# Patient Record
Sex: Female | Born: 1960 | Race: White | Hispanic: No | Marital: Married | Smoking: Never smoker
Health system: Southern US, Community
[De-identification: ages and names within clinical notes are randomized; demographics above are authoritative.]

## PROBLEM LIST (undated history)

## (undated) DIAGNOSIS — R638 Other symptoms and signs concerning food and fluid intake: Secondary | ICD-10-CM

## (undated) DIAGNOSIS — D259 Leiomyoma of uterus, unspecified: Secondary | ICD-10-CM

## (undated) DIAGNOSIS — J309 Allergic rhinitis, unspecified: Secondary | ICD-10-CM

## (undated) DIAGNOSIS — N814 Uterovaginal prolapse, unspecified: Secondary | ICD-10-CM

## (undated) DIAGNOSIS — IMO0002 Reserved for concepts with insufficient information to code with codable children: Secondary | ICD-10-CM

## (undated) DIAGNOSIS — N898 Other specified noninflammatory disorders of vagina: Secondary | ICD-10-CM

## (undated) DIAGNOSIS — I10 Essential (primary) hypertension: Secondary | ICD-10-CM

## (undated) DIAGNOSIS — N816 Rectocele: Secondary | ICD-10-CM

## (undated) DIAGNOSIS — D239 Other benign neoplasm of skin, unspecified: Secondary | ICD-10-CM

## (undated) DIAGNOSIS — K5900 Constipation, unspecified: Secondary | ICD-10-CM

## (undated) DIAGNOSIS — K219 Gastro-esophageal reflux disease without esophagitis: Secondary | ICD-10-CM

## (undated) DIAGNOSIS — N393 Stress incontinence (female) (male): Secondary | ICD-10-CM

## (undated) DIAGNOSIS — N951 Menopausal and female climacteric states: Secondary | ICD-10-CM

## (undated) DIAGNOSIS — E7800 Pure hypercholesterolemia, unspecified: Secondary | ICD-10-CM

## (undated) DIAGNOSIS — L570 Actinic keratosis: Secondary | ICD-10-CM

## (undated) DIAGNOSIS — R5382 Chronic fatigue, unspecified: Secondary | ICD-10-CM

## (undated) HISTORY — DX: Rectocele: N81.6

## (undated) HISTORY — DX: Stress incontinence (female) (male): N39.3

## (undated) HISTORY — DX: Leiomyoma of uterus, unspecified: D25.9

## (undated) HISTORY — DX: Allergic rhinitis, unspecified: J30.9

## (undated) HISTORY — DX: Gastro-esophageal reflux disease without esophagitis: K21.9

## (undated) HISTORY — DX: Actinic keratosis: L57.0

## (undated) HISTORY — DX: Pure hypercholesterolemia, unspecified: E78.00

## (undated) HISTORY — DX: Reserved for concepts with insufficient information to code with codable children: IMO0002

## (undated) HISTORY — DX: Menopausal and female climacteric states: N95.1

## (undated) HISTORY — DX: Other symptoms and signs concerning food and fluid intake: R63.8

## (undated) HISTORY — PX: TONSILLECTOMY: SUR1361

## (undated) HISTORY — DX: Uterovaginal prolapse, unspecified: N81.4

## (undated) HISTORY — PX: MOHS SURGERY: SUR867

## (undated) HISTORY — DX: Other specified noninflammatory disorders of vagina: N89.8

## (undated) HISTORY — PX: COLONOSCOPY: SHX174

---
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
## 2003-12-31 HISTORY — PX: BREAST CYST ASPIRATION: SHX578

## 2004-10-30 ENCOUNTER — Ambulatory Visit: Payer: Self-pay | Admitting: Internal Medicine

## 2006-09-11 ENCOUNTER — Ambulatory Visit: Payer: Self-pay | Admitting: Internal Medicine

## 2006-09-18 ENCOUNTER — Ambulatory Visit: Payer: Self-pay | Admitting: Internal Medicine

## 2006-10-27 ENCOUNTER — Ambulatory Visit: Payer: Self-pay | Admitting: Internal Medicine

## 2008-02-11 ENCOUNTER — Ambulatory Visit: Payer: Self-pay | Admitting: Internal Medicine

## 2011-05-01 ENCOUNTER — Ambulatory Visit: Payer: Self-pay | Admitting: Internal Medicine

## 2011-12-31 LAB — HM COLONOSCOPY: HM COLON: NORMAL

## 2012-02-06 ENCOUNTER — Ambulatory Visit: Payer: Self-pay | Admitting: Obstetrics and Gynecology

## 2012-02-06 LAB — CBC
HCT: 42 % (ref 35.0–47.0)
HGB: 13.9 g/dL (ref 12.0–16.0)
MCH: 29 pg (ref 26.0–34.0)
MCHC: 33.1 g/dL (ref 32.0–36.0)
MCV: 87 fL (ref 80–100)
Platelet: 229 10*3/uL (ref 150–440)
RBC: 4.8 10*6/uL (ref 3.80–5.20)
RDW: 13.9 % (ref 11.5–14.5)

## 2012-02-10 ENCOUNTER — Ambulatory Visit: Payer: Self-pay | Admitting: Obstetrics and Gynecology

## 2012-02-10 LAB — PREGNANCY, URINE: Pregnancy Test, Urine: NEGATIVE m[IU]/mL

## 2012-09-23 ENCOUNTER — Ambulatory Visit: Payer: Self-pay | Admitting: Internal Medicine

## 2013-09-24 ENCOUNTER — Ambulatory Visit: Payer: Self-pay | Admitting: Internal Medicine

## 2014-08-27 DIAGNOSIS — R5382 Chronic fatigue, unspecified: Secondary | ICD-10-CM | POA: Insufficient documentation

## 2014-08-27 DIAGNOSIS — I10 Essential (primary) hypertension: Secondary | ICD-10-CM | POA: Insufficient documentation

## 2014-10-14 ENCOUNTER — Ambulatory Visit: Payer: Self-pay | Admitting: Internal Medicine

## 2015-04-20 LAB — HM PAP SMEAR: HM Pap smear: NEGATIVE

## 2015-04-23 NOTE — H&P (Signed)
PATIENT NAME:  [name], [name] MR#:  [id number] DATE OF BIRTH:  [birth year]  DATE OF ADMISSION:  02/10/2012  CHIEF COMPLAINT: Left Bartholin's gland cyst.  HISTORY OF PRESENT ILLNESS:  Kristy Greene is a 54 year old married white female, para 2-0-0, perimenopausal, on no hormone replacement therapy, using vasectomy for contraception, who presents for surgical management of a newly identified left Bartholin's gland cyst. The patient noted this cystic mass in the vulva approximately four weeks ago. She is minimally symptomatic at this time.   PAST MEDICAL HISTORY:  1. Increased body mass.  2. Increased cholesterol.   PAST SURGICAL HISTORY: Cesarean section times one.  PAST OB HISTORY: Para 2-0-0-2.   FAMILY HISTORY: Colon cancer in grandfather and great grandfather.  Diabetes mellitus in grandmother. Hypertension in parents. No history of ovary or breast cancer.   SOCIAL HISTORY: The patient does not smoke, does not drink, does not use drugs.  REVIEW OF SYSTEMS: The patient denies recent illness, reactive airway disease, history of coagulopathy.   CURRENT MEDICATIONS: None.   DRUG ALLERGIES: None.  PHYSICAL EXAMINATION: VITAL SIGNS:  Height 5 feet 6 inches, weight 213 pounds. Blood pressure 131/90, heart rate 94. The patient is a pleasant, well-appearing white female with normal affect. She is alert and oriented.   HEENT: Normocephalic, atraumatic. Oropharynx clear.   NECK: Supple. No thyromegaly or adenopathy.   LUNGS: Clear.   HEART:  Regular rate and rhythm without murmur.   ABDOMEN: Soft, nontender.   PELVIC:  Vulva notable for a 4-cm cystic mass in the left labia majora consistent with Bartholin's gland cyst; no significant induration or drainage noted. Right labia normal. Bimanual exam reveals a slightly enlarged uterus which is nontender.   EXTREMITIES: Without clubbing, cyanosis, or edema.   IMPRESSION: Left Bartholin's gland cyst.   PLAN: Marsupialization of  Bartholin's gland cyst on 02/10/2012.  CONSENT NOTE: The patient is to undergo marsupialization of the left Bartholin's gland cyst. She is understanding of the planned procedure and is aware of and is accepting of all surgical risks which include but are not limited to bleeding, infection, pelvic organ injury with need for repair, blood clot disorders, and anesthesia risks. All questions are answered. Informed consent is given. The patient is ready and willing to proceed with surgery as scheduled.  ____________________________ Alanda Slim Ranie Chinchilla, MD mad:bjt D: 02/07/2012 10:38:35 ET T: 02/07/2012 10:52:11 ET JOB#: [id number]  cc: Hassell Done A. Birdena Kingma, MD, <Dictator> Alanda Slim Kwali Wrinkle MD ELECTRONICALLY SIGNED 02/14/2012 14:39

## 2015-04-23 NOTE — Op Note (Signed)
PATIENT NAME:  Kristy Greene, Kristy Greene MR#:  [id number] DATE OF BIRTH:  [birth year]  DATE OF PROCEDURE:  02/10/2012  PREOPERATIVE DIAGNOSES: Left Bartholin gland cyst.  POSTOPERATIVE DIAGNOSIS: Left Bartholin gland cyst.  OPERATIVE PROCEDURE: Marsupialization.   SURGEON: Alanda Slim. Reinhard Schack, MD  ANESTHESIA: General by LMA.   INDICATIONS: The patient is a 54 year old white female who presents for surgical management of a newly identified Bartholin gland cyst approximately one month ago.   FINDINGS AT SURGERY: A 4 cm tense mass in the region of the left Bartholin's gland. Upon opening the gland 20 mL of purulent drainage was evacuated. The cyst wall was notably smooth and was not biopsied.   DESCRIPTION OF PROCEDURE: The patient was brought to the Operating Room where she was placed in the supine position. General anesthesia with LMA was induced without difficulty. She was placed in the dorsal lithotomy position and was prepped and draped with a Betadine prep and standard fashion. A Red Robinson catheter was used to drain residual urine from the bladder. Allis clamps were used to grasp the superior and inferior margins of the inner aspect, at the introitus, encompassing the Bartholin gland cyst. The vaginal mucosa was incised and the cyst wall was dissected free. Once adequately dissected, the cyst was entered with a stab puncture with a scalpel and extended vertically. Approximately 20 mL of purulent drainage was evacuated. Palpation of the cyst wall revealed it to be smooth and without nodularity or induration. The marsupialization was then performed with a series of 3-0 Vicryl sutures being placed in a simple interrupted manner circumferentially maintaining the gland opened. The marsupialization site was then copiously irrigated with saline. The procedure was then terminated with all instrumentation being removed from the operative field. The patient was then     mobilized, awakened, and taken to  the Recovery Room in satisfactory condition. Estimated blood loss was 25 mL. There were no complications. All instrument, needle, and sponge counts were verified as correct. ____________________________ Alanda Slim. Kristy Headley, MD mad:slb D: 02/10/2012 19:56:30 ET T: 02/11/2012 09:16:43 ET JOB#: [id number]  cc: Kristy Done A. Rael Yo, MD, <Dictator> Alanda Slim Jovaun Levene MD ELECTRONICALLY SIGNED 02/14/2012 14:40

## 2015-09-19 ENCOUNTER — Other Ambulatory Visit: Payer: Self-pay | Admitting: Internal Medicine

## 2015-09-19 DIAGNOSIS — Z1231 Encounter for screening mammogram for malignant neoplasm of breast: Secondary | ICD-10-CM

## 2015-10-16 ENCOUNTER — Ambulatory Visit
Admission: RE | Admit: 2015-10-16 | Discharge: 2015-10-16 | Disposition: A | Discharge status: Home / Self Care | Payer: BC Managed Care – PPO | Source: Ambulatory Visit | Attending: Internal Medicine | Admitting: Internal Medicine

## 2015-10-16 DIAGNOSIS — Z1231 Encounter for screening mammogram for malignant neoplasm of breast: Secondary | ICD-10-CM | POA: Insufficient documentation

## 2016-04-23 ENCOUNTER — Ambulatory Visit (INDEPENDENT_AMBULATORY_CARE_PROVIDER_SITE_OTHER): Payer: BC Managed Care – PPO | Admitting: Obstetrics and Gynecology

## 2016-04-23 ENCOUNTER — Encounter: Payer: Self-pay | Admitting: Obstetrics and Gynecology

## 2016-04-23 VITALS — BP 129/84 | HR 73 | Ht 66.0 in | Wt 192.2 lb

## 2016-04-23 DIAGNOSIS — Z01419 Encounter for gynecological examination (general) (routine) without abnormal findings: Secondary | ICD-10-CM

## 2016-04-23 DIAGNOSIS — Z1211 Encounter for screening for malignant neoplasm of colon: Secondary | ICD-10-CM

## 2016-04-23 DIAGNOSIS — Z0000 Encounter for general adult medical examination without abnormal findings: Secondary | ICD-10-CM

## 2016-04-23 DIAGNOSIS — R102 Pelvic and perineal pain: Secondary | ICD-10-CM | POA: Diagnosis not present

## 2016-04-23 DIAGNOSIS — N393 Stress incontinence (female) (male): Secondary | ICD-10-CM | POA: Diagnosis not present

## 2016-04-23 DIAGNOSIS — D259 Leiomyoma of uterus, unspecified: Secondary | ICD-10-CM

## 2016-04-23 DIAGNOSIS — Z780 Asymptomatic menopausal state: Secondary | ICD-10-CM

## 2016-04-23 NOTE — Progress Notes (Signed)
Patient ID: Kristy Greene, female   DOB: 12/24/1961, 55 y.o.   MRN: [id number] ANNUAL PREVENTATIVE CARE GYN  ENCOUNTER NOTE  Subjective:       Kristy Greene is a 55 y.o. No obstetric history on file. female here for a routine annual gynecologic exam.  Current complaints: 1.  Left side pain at times- ? D/t fibroid  Bowel and bladder function are normal. Stress incontinence is minimal. No significant vasomotor symptoms. No significant discomfort with intercourse   Gynecologic History No LMP recorded. Patient is postmenopausal. Contraception: post menopausal status Last Pap: 03/2015 neg/ neg. Results were: normal Last mammogram: 2016-thru pcp. Results were: normal  Obstetric History OB History  No data available    Past Medical History  Diagnosis Date  . HPV test positive   . AR (allergic rhinitis)   . Rectocele   . Menopausal state   . SUI (stress urinary incontinence, female)   . Uterine prolapse   . Cystocele   . Increased BMI   . Hypercholesteremia   . Uterine fibroid   . Vaginal dryness     Past Surgical History  Procedure Laterality Date  . Cesarean section    . Breast cyst aspiration Left 2005    Current Outpatient Prescriptions on File Prior to Visit  Medication Sig Dispense Refill  . amLODipine (NORVASC) 5 MG tablet Take by mouth.     No current facility-administered medications on file prior to visit.    No Known Allergies  Social History   Social History  . Marital Status: Married    Spouse Name: N/A  . Number of Children: N/A  . Years of Education: N/A   Occupational History  . Not on file.   Social History Main Topics  . Smoking status: Never Smoker   . Smokeless tobacco: Not on file  . Alcohol Use: Yes     Comment: occas  . Drug Use: No  . Sexual Activity: Yes    Birth Control/ Protection: Post-menopausal   Other Topics Concern  . Not on file   Social History Narrative    Family History  Problem Relation Age of Onset  .  Diabetes Mother   . Heart disease Mother   . Uterine cancer Sister   . Colon cancer Maternal Grandfather   . Diabetes Paternal Grandmother   . Breast cancer Neg Hx   . Ovarian cancer Neg Hx     The following portions of the patient's history were reviewed and updated as appropriate: allergies, current medications, past family history, past medical history, past social history, past surgical history and problem list.  Review of Systems ROS Review of Systems - General ROS: negative for - chills, fatigue, fever, hot flashes, night sweats, weight gain or weight loss Psychological ROS: negative for - anxiety, decreased libido, depression, mood swings, physical abuse or sexual abuse Ophthalmic ROS: negative for - blurry vision, eye pain or loss of vision ENT ROS: negative for - headaches, hearing change, visual changes or vocal changes Allergy and Immunology ROS: negative for - hives, itchy/watery eyes or seasonal allergies Hematological and Lymphatic ROS: negative for - bleeding problems, bruising, swollen lymph nodes or weight loss Endocrine ROS: negative for - galactorrhea, hair pattern changes, hot flashes, malaise/lethargy, mood swings, palpitations, polydipsia/polyuria, skin changes, temperature intolerance or unexpected weight changes Breast ROS: negative for - new or changing breast lumps or nipple discharge Respiratory ROS: negative for - cough or shortness of breath Cardiovascular ROS: negative for - chest pain, irregular  heartbeat, palpitations or shortness of breath Gastrointestinal ROS: no abdominal pain, change in bowel habits, or black or bloody stools Genito-Urinary ROS: no dysuria, trouble voiding, or hematuria Musculoskeletal ROS: negative for - joint pain or joint stiffness Neurological ROS: negative for - bowel and bladder control changes Dermatological ROS: negative for rash and skin lesion changes   Objective:   BP 129/84 mmHg  Pulse 73  Ht 5\' 6"  (1.676 m)  Wt 192 lb  3.2 oz (87.181 kg)  BMI 31.04 kg/m2 CONSTITUTIONAL: Well-developed, well-nourished female in no acute distress.  PSYCHIATRIC: Normal mood and affect. Normal behavior. Normal judgment and thought content. [place]: Alert and oriented to person, place, and time. Normal muscle tone coordination. No cranial nerve deficit noted. HENT:  Normocephalic, atraumatic, External right and left ear normal. Oropharynx is clear and moist EYES: Conjunctivae and EOM are normal. Pupils are equal, round, and reactive to light. No scleral icterus.  NECK: Normal range of motion, supple, no masses.  Normal thyroid.  SKIN: Skin is warm and dry. No rash noted. Not diaphoretic. No erythema. No pallor. CARDIOVASCULAR: Normal heart rate noted, regular rhythm, no murmur. RESPIRATORY: Clear to auscultation bilaterally. Effort and breath sounds normal, no problems with respiration noted. BREASTS: Symmetric in size. No masses, skin changes, nipple drainage, or lymphadenopathy. ABDOMEN: Soft, normal bowel sounds, no distention noted.  No tenderness, rebound or guarding.  BLADDER: Normal PELVIC:  External Genitalia: Normal  BUS: Normal  Vagina: Normal  Cervix: Normal; no cervical motion tenderness  Uterus: Normal; midplane, top normal size, mobile, minimally tender  Adnexa: Normal; no palpable masses; left lower quadrant slightly tender  RV: External Exam NormaI, No Rectal Masses and Normal Sphincter tone  MUSCULOSKELETAL: Normal range of motion. No tenderness.  No cyanosis, clubbing, or edema.  2+ distal pulses. LYMPHATIC: No Axillary, Supraclavicular, or Inguinal Adenopathy.    Assessment:   Annual gynecologic examination 55 y.o. Contraception: post menopausal status and rhythm method [id number]  Menopause, asymptomatic Left lower quadrant pain, unclear etiology; history of fibroids  Plan:  Pap: Not needed Mammogram: thru pcp Stool Guaiac Testing:  Ordered Labs: thru pcp Routine preventative health maintenance  measures emphasized: Exercise/Diet/Weight control, Tobacco Warnings and Alcohol/Substance use risks Calcium with vitamin D supplementation encouraged Pelvic ultrasound ordered. Patient was notified by phone of results. Return to [place], [place]  Brayton Mars, MD  Note: This dictation was prepared with Dragon dictation along with smaller phrase technology. Any transcriptional errors that result from this process are unintentional.

## 2016-04-23 NOTE — Patient Instructions (Signed)
1. Pap smear not done until 2019 2. Mammogram already done 3. Stool guaiac cards given 4. Calcium with vitamin D supplementation encouraged-1200 mg a day 5. Ultrasound is ordered to assess left lower quadrant pain 6. Return in 1 year

## 2016-04-26 ENCOUNTER — Other Ambulatory Visit: Payer: BC Managed Care – PPO

## 2016-04-26 ENCOUNTER — Ambulatory Visit (INDEPENDENT_AMBULATORY_CARE_PROVIDER_SITE_OTHER): Payer: BC Managed Care – PPO

## 2016-04-26 DIAGNOSIS — R102 Pelvic and perineal pain: Secondary | ICD-10-CM

## 2016-05-02 ENCOUNTER — Telehealth: Payer: Self-pay | Admitting: *Deleted

## 2016-05-02 LAB — FECAL OCCULT BLOOD, IMMUNOCHEMICAL: FECAL OCCULT BLD: NEGATIVE

## 2016-05-02 LAB — PLEASE NOTE

## 2016-05-02 NOTE — Telephone Encounter (Signed)
INFORMED PT OF LAB RESULTS.

## 2016-05-02 NOTE — Telephone Encounter (Signed)
Patient called and stated that the nurse called her yesterday and she is returning the nurse call. Patient call back number is [phone]

## 2016-09-20 ENCOUNTER — Other Ambulatory Visit: Payer: Self-pay | Admitting: Internal Medicine

## 2016-09-20 DIAGNOSIS — Z1231 Encounter for screening mammogram for malignant neoplasm of breast: Secondary | ICD-10-CM

## 2016-10-24 ENCOUNTER — Ambulatory Visit
Admission: RE | Admit: 2016-10-24 | Discharge: 2016-10-24 | Disposition: A | Discharge status: Home / Self Care | Payer: BC Managed Care – PPO | Source: Ambulatory Visit | Attending: Internal Medicine | Admitting: Internal Medicine

## 2016-10-24 ENCOUNTER — Other Ambulatory Visit: Payer: Self-pay | Admitting: Internal Medicine

## 2016-10-24 DIAGNOSIS — Z1231 Encounter for screening mammogram for malignant neoplasm of breast: Secondary | ICD-10-CM | POA: Insufficient documentation

## 2017-04-28 NOTE — Progress Notes (Signed)
Patient ID: Kristy Greene, female   DOB: [birth year], 56 y.o.   MRN: [id number] ANNUAL PREVENTATIVE CARE GYN  ENCOUNTER NOTE  Subjective:       Kristy Greene is a 56 y.o.G2 P2002. female here for a routine annual gynecologic exam.  Current complaints: 1. Pelvic pain  2. Urinary frequency 3. noturia- 1-2 x qhs  Kristy Greene presents for annual exam. She is post menopausal with minimal hot flashes, mostly at night, on no HRT.  Current concerns include an increase aching on her sides bilaterally that isn't worsened with activity or any specific event. She also has increase frequency of constipation over the last 2 months for which she has not tried any stool softeners. Denies blood in the stool. Also admits to increased urinary frequency and urgency but denies having to get up more than 2x/night and denies stress incontinence symptoms. She denies fever, chills, increased fatigue, palpitations, vaginal dryness, and vaginal bleeding.   Gynecologic History No LMP recorded. Patient is postmenopausal. Contraception: post menopausal status Last Pap: 03/2015 neg/ neg. Results were: normal Last mammogram:09/2016 birad 1. Results were: normal  Obstetric History OB History  No data available    Past Medical History:  Diagnosis Date  . AR (allergic rhinitis)   . Cystocele   . HPV test positive   . Hypercholesteremia   . Increased BMI   . Menopausal state   . Rectocele   . SUI (stress urinary incontinence, female)   . Uterine fibroid   . Uterine prolapse   . Vaginal dryness     Past Surgical History:  Procedure Laterality Date  . BREAST CYST ASPIRATION Left 2005  . CESAREAN SECTION      Current Outpatient Prescriptions on File Prior to Visit  Medication Sig Dispense Refill  . amLODipine (NORVASC) 5 MG tablet Take by mouth.    . Multiple Vitamin (MULTI VITAMIN DAILY PO) Take by mouth.     No current facility-administered medications on file prior to visit.     No Known  Allergies  Social History   Social History  . Marital status: Married    Spouse name: N/A  . Number of children: N/A  . Years of education: N/A   Occupational History  . Not on file.   Social History Main Topics  . Smoking status: Never Smoker  . Smokeless tobacco: Not on file  . Alcohol use Yes     Comment: occas  . Drug use: No  . Sexual activity: Yes    Birth control/ protection: Post-menopausal   Other Topics Concern  . Not on file   Social History Narrative  . No narrative on file    Family History  Problem Relation Age of Onset  . Diabetes Mother   . Heart disease Mother   . Uterine cancer Sister   . Colon cancer Maternal Grandfather   . Diabetes Paternal Grandmother   . Breast cancer Neg Hx   . Ovarian cancer Neg Hx     The following portions of the patient's history were reviewed and updated as appropriate: allergies, current medications, past family history, past medical history, past social history, past surgical history and problem list.  Review of Systems Review of Systems  Constitutional: Negative.   HENT: Negative for hearing loss.   Eyes: Negative for blurred vision, double vision and pain.  Respiratory: Negative for cough and shortness of breath.   Cardiovascular: Negative for chest pain and palpitations.  Gastrointestinal: Positive for abdominal pain and constipation.  Negative for blood in stool, nausea and vomiting.  Genitourinary: Positive for frequency and urgency. Negative for dysuria and hematuria.  Musculoskeletal: Negative for joint pain and neck pain.  Neurological: Negative for headaches.    Objective:   BP (!) 141/81   Pulse 68   Ht 5\' 6"  (1.676 m)   Wt 200 lb 14.4 oz (91.1 kg)   BMI 32.43 kg/m   CONSTITUTIONAL: Well-developed, well-nourished female in no acute distress.  PSYCHIATRIC: Normal mood and affect. Normal behavior. Normal judgment and thought content. Kristy Greene: Alert. Normal muscle tone coordination. No cranial  nerve deficit noted. HENT:  Normocephalic, atraumatic. EYES: Conjunctivae and EOM are normal. No scleral icterus.  NECK: Normal range of motion, supple, no masses.  Normal thyroid.  SKIN: Skin is warm and dry. No rash noted. Not diaphoretic. No erythema. No pallor. CARDIOVASCULAR: RRR, no murmur. RESPIRATORY: Clear to auscultation bilaterally. Effort and breath sounds normal. BREASTS: Symmetric in size. No fibrocystic changes, masses, nodules, skin changes, nipple drainage, or lymphadenopathy. ABDOMEN: Soft, normal bowel sounds, no distention noted.  No tenderness, or guarding.  BLADDER: Normal PELVIC:  External Genitalia: Normal  BUS: Normal  Vagina: Normal  Cervix: Normal; no cervical motion tenderness  Uterus: Normal; midplane, top normal size, mobile, minimally tender  Adnexa: Normal; no palpable masses; left lower quadrant slightly tender  RV: External Exam NormaI, No Rectal Masses and Normal Sphincter tone  MUSCULOSKELETAL: Kristy Greene, or edema.  LYMPHATIC: No Axillary or Supraclavicular    Assessment:   Annual gynecologic examination 56 y.o. Contraception: post menopausal status and rhythm method Bmi- 32 weight gain x 10 pounds this year Menopause, minimally symptomatic, no HRT  Constipation Urinary frequency/urgency,non acute   Plan:  Pap: Due 2019 Mammogram: thru pcp- due october Stool Guaiac Testing:  Ordered  Encouraged increased water and fiber intake and use of OTC stool softeners such as Colace.  Check UA, C&S to rule out infection as source of increased urinary frequency/urgency Labs: thru pcp UA with Culture today Routine preventative health maintenance measures emphasized: Exercise/Diet/Weight control, Tobacco Warnings and Alcohol/Substance use risks Calcium with vitamin D supplementation encouraged Return to Clinic - [place], [place]  Aliene Altes, PA-S Brayton Mars, MD   I have seen, interviewed, and examined the patient in  conjunction with the [facility].A. student and affirm the diagnosis and management plan. Moishy Laday A. Kaimana Lurz, MD, FACOG   Note: This dictation was prepared with Dragon dictation along with smaller phrase technology. Any transcriptional errors that result from this process are unintentional.

## 2017-04-29 ENCOUNTER — Encounter: Payer: Self-pay | Admitting: Obstetrics and Gynecology

## 2017-04-29 ENCOUNTER — Ambulatory Visit (INDEPENDENT_AMBULATORY_CARE_PROVIDER_SITE_OTHER): Payer: BC Managed Care – PPO | Admitting: Obstetrics and Gynecology

## 2017-04-29 VITALS — BP 141/81 | HR 68 | Ht 66.0 in | Wt 200.9 lb

## 2017-04-29 DIAGNOSIS — Z01419 Encounter for gynecological examination (general) (routine) without abnormal findings: Secondary | ICD-10-CM

## 2017-04-29 DIAGNOSIS — Z1211 Encounter for screening for malignant neoplasm of colon: Secondary | ICD-10-CM

## 2017-04-29 DIAGNOSIS — R350 Frequency of micturition: Secondary | ICD-10-CM | POA: Diagnosis not present

## 2017-04-29 DIAGNOSIS — Z780 Asymptomatic menopausal state: Secondary | ICD-10-CM

## 2017-04-29 DIAGNOSIS — K5900 Constipation, unspecified: Secondary | ICD-10-CM | POA: Diagnosis not present

## 2017-04-29 DIAGNOSIS — E669 Obesity, unspecified: Secondary | ICD-10-CM

## 2017-04-29 LAB — POCT URINALYSIS DIPSTICK
BILIRUBIN UA: NEGATIVE
Blood, UA: NEGATIVE
GLUCOSE UA: NEGATIVE
Ketones, UA: NEGATIVE
NITRITE UA: NEGATIVE
PH UA: 7 (ref 5.0–8.0)
Protein, UA: NEGATIVE
Spec Grav, UA: 1.01 (ref 1.010–1.025)
Urobilinogen, UA: NEGATIVE E.U./dL — AB

## 2017-04-29 NOTE — Patient Instructions (Signed)
1. No Pap smear needed 2. Mammogram to be obtained in October 3. Stool guaiac cards are given for colon cancer screening 4. Continue with healthy eating, exercise, and bone loss and pounds in 1 year 5. Continue with calcium and vitamin D supplementation 6. Return in 1 year for annual exam 7. Urinalysis and urine culture are obtained to rule out infection  Health Maintenance, Female Adopting a healthy lifestyle and getting preventive care can go a long way to promote health and wellness. Talk with your health care provider about what schedule of regular examinations is right for you. This is a good chance for you to check in with your provider about disease prevention and staying healthy. In between checkups, there are plenty of things you can do on your own. Experts have done a lot of research about which lifestyle changes and preventive measures are most likely to keep you healthy. Ask your health care provider for more information. Weight and diet Eat a healthy diet  Be sure to include plenty of vegetables, fruits, low-fat dairy products, and lean protein.  Do not eat a lot of foods high in solid fats, added sugars, or salt.  Get regular exercise. This is one of the most important things you can do for your health.  Most adults should exercise for at least 150 minutes each week. The exercise should increase your heart rate and make you sweat (moderate-intensity exercise).  Most adults should also do strengthening exercises at least twice a week. This is in addition to the moderate-intensity exercise. Maintain a healthy weight  Body mass index (BMI) is a measurement that can be used to identify possible weight problems. It estimates body fat based on height and weight. Your health care provider can help determine your BMI and help you achieve or maintain a healthy weight.  For females 85 years of age and older:  A BMI below 18.5 is considered underweight.  A BMI of 18.5 to 24.9 is  normal.  A BMI of 25 to 29.9 is considered overweight.  A BMI of 30 and above is considered obese. Watch levels of cholesterol and blood lipids  You should start having your blood tested for lipids and cholesterol at 56 years of age, then have this test every 5 years.  You may need to have your cholesterol levels checked more often if:  Your lipid or cholesterol levels are high.  You are older than 56 years of age.  You are at high risk for heart disease. Cancer screening Lung Cancer  Lung cancer screening is recommended for adults 7-2 years old who are at high risk for lung cancer because of a history of smoking.  A yearly low-dose CT scan of the lungs is recommended for people who:  Currently smoke.  Have quit within the past 15 years.  Have at least a 30-pack-year history of smoking. A pack year is smoking an average of one pack of cigarettes a day for 1 year.  Yearly screening should continue until it has been 15 years since you quit.  Yearly screening should stop if you develop a health problem that would prevent you from having lung cancer treatment. Breast Cancer  Practice breast self-awareness. This means understanding how your breasts normally appear and feel.  It also means doing regular breast self-exams. Let your health care provider know about any changes, no matter how small.  If you are in your 20s or 30s, you should have a clinical breast exam (CBE) by a  health care provider every 1-3 years as part of a regular health exam.  If you are 36 or older, have a CBE every year. Also consider having a breast X-ray (mammogram) every year.  If you have a family history of breast cancer, talk to your health care provider about genetic screening.  If you are at high risk for breast cancer, talk to your health care provider about having an MRI and a mammogram every year.  Breast cancer gene (BRCA) assessment is recommended for women who have family members with  BRCA-related cancers. BRCA-related cancers include:  Breast.  Ovarian.  Tubal.  Peritoneal cancers.  Results of the assessment will determine the need for genetic counseling and BRCA1 and BRCA2 testing. Cervical Cancer  Your health care provider may recommend that you be screened regularly for cancer of the pelvic organs (ovaries, uterus, and vagina). This screening involves a pelvic examination, including checking for microscopic changes to the surface of your cervix (Pap test). You may be encouraged to have this screening done every 3 years, beginning at age 24.  For women ages 57-65, health care providers may recommend pelvic exams and Pap testing every 3 years, or they may recommend the Pap and pelvic exam, combined with testing for human papilloma virus (HPV), every 5 years. Some types of HPV increase your risk of cervical cancer. Testing for HPV may also be done on women of any age with unclear Pap test results.  Other health care providers may not recommend any screening for nonpregnant women who are considered low risk for pelvic cancer and who do not have symptoms. Ask your health care provider if a screening pelvic exam is right for you.  If you have had past treatment for cervical cancer or a condition that could lead to cancer, you need Pap tests and screening for cancer for at least 20 years after your treatment. If Pap tests have been discontinued, your risk factors (such as having a new sexual partner) need to be reassessed to determine if screening should resume. Some women have medical problems that increase the chance of getting cervical cancer. In these cases, your health care provider may recommend more frequent screening and Pap tests. Colorectal Cancer  This type of cancer can be detected and often prevented.  Routine colorectal cancer screening usually begins at 56 years of age and continues through 56 years of age.  Your health care provider may recommend screening at  an earlier age if you have risk factors for colon cancer.  Your health care provider may also recommend using home test kits to check for hidden blood in the stool.  A small camera at the end of a tube can be used to examine your colon directly (sigmoidoscopy or colonoscopy). This is done to check for the earliest forms of colorectal cancer.  Routine screening usually begins at age 26.  Direct examination of the colon should be repeated every 5-10 years through 56 years of age. However, you may need to be screened more often if early forms of precancerous polyps or small growths are found. Skin Cancer  Check your skin from head to toe regularly.  Tell your health care provider about any new moles or changes in moles, especially if there is a change in a mole's shape or color.  Also tell your health care provider if you have a mole that is larger than the size of a pencil eraser.  Always use sunscreen. Apply sunscreen liberally and repeatedly throughout the day.  Protect yourself by wearing long sleeves, pants, a wide-brimmed hat, and sunglasses whenever you are outside. Heart disease, diabetes, and high blood pressure  High blood pressure causes heart disease and increases the risk of stroke. High blood pressure is more likely to develop in:  People who have blood pressure in the high end of the normal range (130-139/85-89 mm Hg).  People who are overweight or obese.  People who are African American.  If you are 103-57 years of age, have your blood pressure checked every 3-5 years. If you are 66 years of age or older, have your blood pressure checked every year. You should have your blood pressure measured twice-once when you are at a hospital or clinic, and once when you are not at a hospital or clinic. Record the average of the two measurements. To check your blood pressure when you are not at a hospital or clinic, you can use:  An automated blood pressure machine at a pharmacy.  A  home blood pressure monitor.  If you are between 2 years and 21 years old, ask your health care provider if you should take aspirin to prevent strokes.  Have regular diabetes screenings. This involves taking a blood sample to check your fasting blood sugar level.  If you are at a normal weight and have a low risk for diabetes, have this test once every three years after 56 years of age.  If you are overweight and have a high risk for diabetes, consider being tested at a younger age or more often. Preventing infection Hepatitis B  If you have a higher risk for hepatitis B, you should be screened for this virus. You are considered at high risk for hepatitis B if:  You were born in a country where hepatitis B is common. Ask your health care provider which countries are considered high risk.  Your parents were born in a high-risk country, and you have not been immunized against hepatitis B (hepatitis B vaccine).  You have HIV or AIDS.  You use needles to inject street drugs.  You live with someone who has hepatitis B.  You have had sex with someone who has hepatitis B.  You get hemodialysis treatment.  You take certain medicines for conditions, including cancer, organ transplantation, and autoimmune conditions. Hepatitis C  Blood testing is recommended for:  Everyone born from [birth year] through 1965.  Anyone with known risk factors for hepatitis C. Sexually transmitted infections (STIs)  You should be screened for sexually transmitted infections (STIs) including gonorrhea and chlamydia if:  You are sexually active and are younger than 56 years of age.  You are older than 56 years of age and your health care provider tells you that you are at risk for this type of infection.  Your sexual activity has changed since you were last screened and you are at an increased risk for chlamydia or gonorrhea. Ask your health care provider if you are at risk.  If you do not have HIV, but are  at risk, it may be recommended that you take a prescription medicine daily to prevent HIV infection. This is called pre-exposure prophylaxis (PrEP). You are considered at risk if:  You are sexually active and do not regularly use condoms or know the HIV status of your partner(s).  You take drugs by injection.  You are sexually active with a partner who has HIV. Talk with your health care provider about whether you are at high risk of being infected with HIV.  If you choose to begin PrEP, you should first be tested for HIV. You should then be tested every 3 months for as long as you are taking PrEP. Pregnancy  If you are premenopausal and you may become pregnant, ask your health care provider about preconception counseling.  If you may become pregnant, take 400 to 800 micrograms (mcg) of folic acid every day.  If you want to prevent pregnancy, talk to your health care provider about birth control (contraception). Osteoporosis and menopause  Osteoporosis is a disease in which the bones lose minerals and strength with aging. This can result in serious bone fractures. Your risk for osteoporosis can be identified using a bone density scan.  If you are 35 years of age or older, or if you are at risk for osteoporosis and fractures, ask your health care provider if you should be screened.  Ask your health care provider whether you should take a calcium or vitamin D supplement to lower your risk for osteoporosis.  Menopause may have certain physical symptoms and risks.  Hormone replacement therapy may reduce some of these symptoms and risks. Talk to your health care provider about whether hormone replacement therapy is right for you. Follow these instructions at home:  Schedule regular health, dental, and eye exams.  Stay current with your immunizations.  Do not use any tobacco products including cigarettes, chewing tobacco, or electronic cigarettes.  If you are pregnant, do not drink  alcohol.  If you are breastfeeding, limit how much and how often you drink alcohol.  Limit alcohol intake to no more than 1 drink per day for nonpregnant women. One drink equals 12 ounces of beer, 5 ounces of wine, or 1 ounces of hard liquor.  Do not use street drugs.  Do not share needles.  Ask your health care provider for help if you need support or information about quitting drugs.  Tell your health care provider if you often feel depressed.  Tell your health care provider if you have ever been abused or do not feel safe at home. This information is not intended to replace advice given to you by your health care provider. Make sure you discuss any questions you have with your health care provider. Document Released: 07/01/2011 Document Revised: 05/23/2016 Document Reviewed: 09/19/2015 Elsevier Interactive Patient Education  2017 [employer].

## 2017-05-01 LAB — URINE CULTURE: Organism ID, Bacteria: NO GROWTH

## 2017-06-26 LAB — FECAL OCCULT BLOOD, IMMUNOCHEMICAL: FECAL OCCULT BLD: NEGATIVE

## 2017-06-26 LAB — PLEASE NOTE

## 2017-09-26 ENCOUNTER — Other Ambulatory Visit: Payer: Self-pay | Admitting: Internal Medicine

## 2017-09-26 DIAGNOSIS — Z1231 Encounter for screening mammogram for malignant neoplasm of breast: Secondary | ICD-10-CM

## 2017-10-27 ENCOUNTER — Ambulatory Visit
Admission: RE | Admit: 2017-10-27 | Discharge: 2017-10-27 | Disposition: A | Discharge status: Home / Self Care | Payer: BC Managed Care – PPO | Source: Ambulatory Visit | Attending: Internal Medicine | Admitting: Internal Medicine

## 2017-10-27 DIAGNOSIS — Z1231 Encounter for screening mammogram for malignant neoplasm of breast: Secondary | ICD-10-CM | POA: Diagnosis present

## 2018-05-01 NOTE — Progress Notes (Signed)
Patient ID: Kristy Greene, female   DOB: 24-Dec-1961, 57 y.o.   MRN: [id number] ANNUAL PREVENTATIVE CARE GYN  ENCOUNTER NOTE  Subjective:       Kristy Greene is a 57 y.o.G2 P2002. female here for a routine annual gynecologic exam.  Current complaints:  1. Nocturia sometime 4; Kristy Greene does not report significant urinary frequency, urgency, dysuria, or hematuria.  She does not report any recent bladder infection.  Kristy Greene does not drink significant fluids prior to bedtime; she does not drink significant amounts of caffeinated beverages. 2. Pelvic pain at times; does not affect routine activities  Kristy Greene presents for annual exam. She is post menopausal with minimal hot flashes, mostly at night, on no HRT.   She does get screening labs and her mammograms through primary [facility]   Gynecologic History No LMP recorded. Patient is postmenopausal. Contraception: post menopausal status Last Pap: 03/2015 neg/ neg. Results were: normal Last mammogram:09/2017 birad 1. Results were: normal  Obstetric History OB History  Gravida Para Term Preterm AB Living  2 2 2     2   SAB TAB Ectopic Multiple Live Births          2    # Outcome Date GA Lbr Len/2nd Weight Sex Delivery Anes PTL Lv  2 Term 1994   11 lb 6.4 oz (5.171 kg) M CS-LTranv   LIV  1 Term 1990   9 lb 6.4 oz (4.264 kg) F Vag-Spont   LIV    Past Medical History:  Diagnosis Date  . AR (allergic rhinitis)   . Cystocele   . HPV test positive   . Hypercholesteremia   . Increased BMI   . Menopausal state   . Rectocele   . SUI (stress urinary incontinence, female)   . Uterine fibroid   . Uterine prolapse   . Vaginal dryness     Past Surgical History:  Procedure Laterality Date  . BREAST CYST ASPIRATION Left 2005  . CESAREAN SECTION      Current Outpatient Medications on File Prior to Visit  Medication Sig Dispense Refill  . amLODipine (NORVASC) 5 MG tablet Take by mouth.    . calcium-vitamin D 250-100 MG-UNIT  tablet Take 1 tablet by mouth 2 (two) times daily.    . Multiple Vitamin (MULTI VITAMIN DAILY PO) Take by mouth.     No current facility-administered medications on file prior to visit.     No Known Allergies  Social History   Socioeconomic History  . Marital status: Married    Spouse name: Not on file  . Number of children: Not on file  . Years of education: Not on file  . Highest education level: Not on file  Occupational History  . Not on file  Social Needs  . Financial resource strain: Not on file  . Food insecurity:    Worry: Not on file    Inability: Not on file  . Transportation needs:    Medical: Not on file    Non-medical: Not on file  Tobacco Use  . Smoking status: Never Smoker  . Smokeless tobacco: Never Used  Substance and Sexual Activity  . Alcohol use: Yes    Comment: occas  . Drug use: No  . Sexual activity: Yes    Birth control/protection: Post-menopausal  Lifestyle  . Physical activity:    Days per week: Not on file    Minutes per session: Not on file  . Stress: Not on file  Relationships  .  Social connections:    Talks on phone: Not on file    Gets together: Not on file    Attends religious service: Not on file    Active member of club or organization: Not on file    Attends meetings of clubs or organizations: Not on file    Relationship status: Not on file  . Intimate partner violence:    Fear of current or ex partner: Not on file    Emotionally abused: Not on file    Physically abused: Not on file    Forced sexual activity: Not on file  Other Topics Concern  . Not on file  Social History Narrative  . Not on file    Family History  Problem Relation Age of Onset  . Diabetes Mother   . Heart disease Mother   . Uterine cancer Sister   . Colon cancer Maternal Grandfather   . Diabetes Paternal Grandmother   . Breast cancer Neg Hx   . Ovarian cancer Neg Hx     The following portions of the patient's history were reviewed and updated  as appropriate: allergies, current medications, past family history, past medical history, past social history, past surgical history and problem list.  Review of Systems Review of Systems  Constitutional: Negative.   HENT: Negative.   Eyes: Negative.   Respiratory: Negative.   Cardiovascular: Negative.   Gastrointestinal: Negative.   Genitourinary:       Nocturia 2-4 per night  Musculoskeletal: Negative.   Skin: Positive for rash.       No complaint of rash but vulvar rash noted on exam  Neurological: Negative.   Endo/Heme/Allergies: Negative.   Psychiatric/Behavioral: Negative.      Objective:  BP 118/79   Pulse 73   Ht 5\' 6"  (1.676 m)   Wt 212 lb 4.8 oz (96.3 kg)   BMI 34.27 kg/m  CONSTITUTIONAL: Well-developed, well-nourished female in no acute distress.  PSYCHIATRIC: Normal mood and affect. Normal behavior. Normal judgment and thought content. [place]: Alert. Normal muscle tone coordination. No cranial nerve deficit noted. HENT:  Normocephalic, atraumatic. EYES: Conjunctivae and EOM are normal. No scleral icterus.  NECK: Normal range of motion, supple, no masses.  Normal thyroid.  SKIN: Skin is warm and dry. No rash noted. Not diaphoretic. No erythema. No pallor. CARDIOVASCULAR: RRR, no murmur. RESPIRATORY: Clear to auscultation bilaterally. Effort and breath sounds normal. BREASTS: Symmetric in size. No fibrocystic changes, masses, nodules, skin changes, nipple drainage, or lymphadenopathy. ABDOMEN: Soft, normal bowel sounds, no distention noted.  No tenderness, or guarding.  BLADDER: Normal PELVIC:  External Genitalia: Prominent vulvar hyperemia, symmetric involving labia majora; there appears to be symmetric erythema/chafing in the inguinal region bilaterally (patient notes no vulvar symptoms)  BUS: Normal  Vagina: Normal; no discharge  Cervix: Normal; no cervical motion tenderness  Uterus: Normal; midplane, top normal size, mobile, non-tender  Adnexa: Normal;  no palpable masses; nontender  RV: Firm stool in rectal vault, External Exam NormaI and Normal Sphincter tone MUSCULOSKELETAL: Noyanosis, or edema.  LYMPHATIC: No Axillary or Supraclavicular    Assessment:   Annual gynecologic examination 57 y.o. Contraception: post menopausal status and rhythm method Bmi- 34  Menopause, minimally symptomatic, no HRT  Chronic vulvitis and groin irritation, asymptomatic     Plan:  Pap: pap/hpv Mammogram: thru pcp- due october Stool Guaiac Testing:  Ordered  Labs: thru pcp  Routine preventative health maintenance measures emphasized: Exercise/Diet/Weight control, Tobacco Warnings and Alcohol/Substance use risks Calcium with vitamin D supplementation  encouraged Nystatin/triamcinolone cream topically to the vulva and inguinal region twice a day for 14 days Return to [place], CMA  Brayton Mars, MD  Note: This dictation was prepared with Dragon dictation along with smaller phrase technology. Any transcriptional errors that result from this process are unintentional.

## 2018-05-05 ENCOUNTER — Encounter: Payer: Self-pay | Admitting: Obstetrics and Gynecology

## 2018-05-05 ENCOUNTER — Ambulatory Visit (INDEPENDENT_AMBULATORY_CARE_PROVIDER_SITE_OTHER): Payer: BC Managed Care – PPO | Admitting: Obstetrics and Gynecology

## 2018-05-05 VITALS — BP 118/79 | HR 73 | Ht 66.0 in | Wt 212.3 lb

## 2018-05-05 DIAGNOSIS — Z1211 Encounter for screening for malignant neoplasm of colon: Secondary | ICD-10-CM | POA: Diagnosis not present

## 2018-05-05 DIAGNOSIS — Z01419 Encounter for gynecological examination (general) (routine) without abnormal findings: Secondary | ICD-10-CM

## 2018-05-05 DIAGNOSIS — R351 Nocturia: Secondary | ICD-10-CM

## 2018-05-05 DIAGNOSIS — E669 Obesity, unspecified: Secondary | ICD-10-CM | POA: Diagnosis not present

## 2018-05-05 DIAGNOSIS — N763 Subacute and chronic vulvitis: Secondary | ICD-10-CM

## 2018-05-05 DIAGNOSIS — Z780 Asymptomatic menopausal state: Secondary | ICD-10-CM

## 2018-05-05 LAB — POCT URINALYSIS DIPSTICK
Bilirubin, UA: NEGATIVE
Glucose, UA: NEGATIVE
Ketones, UA: NEGATIVE
LEUKOCYTES UA: NEGATIVE
NITRITE UA: NEGATIVE
Odor: NEGATIVE
RBC UA: NEGATIVE
SPEC GRAV UA: 1.02 (ref 1.010–1.025)
Urobilinogen, UA: 0.2 E.U./dL
pH, UA: 6 (ref 5.0–8.0)

## 2018-05-05 NOTE — Patient Instructions (Addendum)
1.  Pap smear is done. 2.  Mammogram is to be obtained through primary care in October 3.  Screening labs are to be obtained through primary care 4.  Stool guaiac cards are given for colon cancer screening 5.  Continue with healthy eating and exercise and controlled weight loss 6.  Continue calcium and vitamin D supplementation 7.  Nystatin/triamcinolone cream is to be applied topically to the vulvar irritation and the groin irritation twice a day for 2 weeks 8.  Return in 1 year for annual exam 9.  Urine culture is obtained to rule out bladder infection   Health Maintenance for Postmenopausal Women Menopause is a normal process in which your reproductive ability comes to an end. This process happens gradually over a span of months to years, usually between the ages of 66 and 2. Menopause is complete when you have missed 12 consecutive menstrual periods. It is important to talk with your health care provider about some of the most common conditions that affect postmenopausal women, such as heart disease, cancer, and bone loss (osteoporosis). Adopting a healthy lifestyle and getting preventive care can help to promote your health and wellness. Those actions can also lower your chances of developing some of these common conditions. What should I know about menopause? During menopause, you may experience a number of symptoms, such as:  Moderate-to-severe hot flashes.  Night sweats.  Decrease in sex drive.  Mood swings.  Headaches.  Tiredness.  Irritability.  Memory problems.  Insomnia.  Choosing to treat or not to treat menopausal changes is an individual decision that you make with your health care provider. What should I know about hormone replacement therapy and supplements? Hormone therapy products are effective for treating symptoms that are associated with menopause, such as hot flashes and night sweats. Hormone replacement carries certain risks, especially as you become  older. If you are thinking about using estrogen or estrogen with progestin treatments, discuss the benefits and risks with your health care provider. What should I know about heart disease and stroke? Heart disease, heart attack, and stroke become more likely as you age. This may be due, in part, to the hormonal changes that your body experiences during menopause. These can affect how your body processes dietary fats, triglycerides, and cholesterol. Heart attack and stroke are both medical emergencies. There are many things that you can do to help prevent heart disease and stroke:  Have your blood pressure checked at least every 1-2 years. High blood pressure causes heart disease and increases the risk of stroke.  If you are 24-60 years old, ask your health care provider if you should take aspirin to prevent a heart attack or a stroke.  Do not use any tobacco products, including cigarettes, chewing tobacco, or electronic cigarettes. If you need help quitting, ask your health care provider.  It is important to eat a healthy diet and maintain a healthy weight. ? Be sure to include plenty of vegetables, fruits, low-fat dairy products, and lean protein. ? Avoid eating foods that are high in solid fats, added sugars, or salt (sodium).  Get regular exercise. This is one of the most important things that you can do for your health. ? Try to exercise for at least 150 minutes each week. The type of exercise that you do should increase your heart rate and make you sweat. This is known as moderate-intensity exercise. ? Try to do strengthening exercises at least twice each week. Do these in addition to the moderate-intensity  exercise.  Know your numbers.Ask your health care provider to check your cholesterol and your blood glucose. Continue to have your blood tested as directed by your health care provider.  What should I know about cancer screening? There are several types of cancer. Take the following  steps to reduce your risk and to catch any cancer development as early as possible. Breast Cancer  Practice breast self-awareness. ? This means understanding how your breasts normally appear and feel. ? It also means doing regular breast self-exams. Let your health care provider know about any changes, no matter how small.  If you are 6 or older, have a clinician do a breast exam (clinical breast exam or CBE) every year. Depending on your age, family history, and medical history, it may be recommended that you also have a yearly breast X-ray (mammogram).  If you have a family history of breast cancer, talk with your health care provider about genetic screening.  If you are at high risk for breast cancer, talk with your health care provider about having an MRI and a mammogram every year.  Breast cancer (BRCA) gene test is recommended for women who have family members with BRCA-related cancers. Results of the assessment will determine the need for genetic counseling and BRCA1 and for BRCA2 testing. BRCA-related cancers include these types: ? Breast. This occurs in males or females. ? Ovarian. ? Tubal. This may also be called fallopian tube cancer. ? Cancer of the abdominal or pelvic lining (peritoneal cancer). ? Prostate. ? Pancreatic.  Cervical, Uterine, and Ovarian Cancer Your health care provider may recommend that you be screened regularly for cancer of the pelvic organs. These include your ovaries, uterus, and vagina. This screening involves a pelvic exam, which includes checking for microscopic changes to the surface of your cervix (Pap test).  For women ages 21-65, health care providers may recommend a pelvic exam and a Pap test every three years. For women ages 1-65, they may recommend the Pap test and pelvic exam, combined with testing for human papilloma virus (HPV), every five years. Some types of HPV increase your risk of cervical cancer. Testing for HPV may also be done on women  of any age who have unclear Pap test results.  Other health care providers may not recommend any screening for nonpregnant women who are considered low risk for pelvic cancer and have no symptoms. Ask your health care provider if a screening pelvic exam is right for you.  If you have had past treatment for cervical cancer or a condition that could lead to cancer, you need Pap tests and screening for cancer for at least 20 years after your treatment. If Pap tests have been discontinued for you, your risk factors (such as having a new sexual partner) need to be reassessed to determine if you should start having screenings again. Some women have medical problems that increase the chance of getting cervical cancer. In these cases, your health care provider may recommend that you have screening and Pap tests more often.  If you have a family history of uterine cancer or ovarian cancer, talk with your health care provider about genetic screening.  If you have vaginal bleeding after reaching menopause, tell your health care provider.  There are currently no reliable tests available to screen for ovarian cancer.  Lung Cancer Lung cancer screening is recommended for adults 84-84 years old who are at high risk for lung cancer because of a history of smoking. A yearly low-dose CT scan  of the lungs is recommended if you:  Currently smoke.  Have a history of at least 30 pack-years of smoking and you currently smoke or have quit within the past 15 years. A pack-year is smoking an average of one pack of cigarettes per day for one year.  Yearly screening should:  Continue until it has been 15 years since you quit.  Stop if you develop a health problem that would prevent you from having lung cancer treatment.  Colorectal Cancer  This type of cancer can be detected and can often be prevented.  Routine colorectal cancer screening usually begins at age 31 and continues through age 64.  If you have risk  factors for colon cancer, your health care provider may recommend that you be screened at an earlier age.  If you have a family history of colorectal cancer, talk with your health care provider about genetic screening.  Your health care provider may also recommend using home test kits to check for hidden blood in your stool.  A small camera at the end of a tube can be used to examine your colon directly (sigmoidoscopy or colonoscopy). This is done to check for the earliest forms of colorectal cancer.  Direct examination of the colon should be repeated every 5-10 years until age 39. However, if early forms of precancerous polyps or small growths are found or if you have a family history or genetic risk for colorectal cancer, you may need to be screened more often.  Skin Cancer  Check your skin from head to toe regularly.  Monitor any moles. Be sure to tell your health care provider: ? About any new moles or changes in moles, especially if there is a change in a mole's shape or color. ? If you have a mole that is larger than the size of a pencil eraser.  If any of your family members has a history of skin cancer, especially at a young age, talk with your health care provider about genetic screening.  Always use sunscreen. Apply sunscreen liberally and repeatedly throughout the day.  Whenever you are outside, protect yourself by wearing long sleeves, pants, a wide-brimmed hat, and sunglasses.  What should I know about osteoporosis? Osteoporosis is a condition in which bone destruction happens more quickly than new bone creation. After menopause, you may be at an increased risk for osteoporosis. To help prevent osteoporosis or the bone fractures that can happen because of osteoporosis, the following is recommended:  If you are 21-69 years old, get at least 1,000 mg of calcium and at least 600 mg of vitamin D per day.  If you are older than age 3 but younger than age 46, get at least 1,200  mg of calcium and at least 600 mg of vitamin D per day.  If you are older than age 82, get at least 1,200 mg of calcium and at least 800 mg of vitamin D per day.  Smoking and excessive alcohol intake increase the risk of osteoporosis. Eat foods that are rich in calcium and vitamin D, and do weight-bearing exercises several times each week as directed by your health care provider. What should I know about how menopause affects my mental health? Depression may occur at any age, but it is more common as you become older. Common symptoms of depression include:  Low or sad mood.  Changes in sleep patterns.  Changes in appetite or eating patterns.  Feeling an overall lack of motivation or enjoyment of activities that  you previously enjoyed.  Frequent crying spells.  Talk with your health care provider if you think that you are experiencing depression. What should I know about immunizations? It is important that you get and maintain your immunizations. These include:  Tetanus, diphtheria, and pertussis (Tdap) booster vaccine.  Influenza every year before the flu season begins.  Pneumonia vaccine.  Shingles vaccine.  Your health care provider may also recommend other immunizations. This information is not intended to replace advice given to you by your health care provider. Make sure you discuss any questions you have with your health care provider. Document Released: 02/07/2006 Document Revised: 07/05/2016 Document Reviewed: 09/19/2015 Elsevier Interactive Patient Education  2018 [employer].

## 2018-05-07 LAB — URINE CULTURE

## 2018-05-08 LAB — IGP, COBASHPV16/18
HPV 16: NEGATIVE
HPV 18: NEGATIVE
HPV other hr types: NEGATIVE
PAP Smear Comment: 0

## 2018-05-22 LAB — FECAL OCCULT BLOOD, IMMUNOCHEMICAL: FECAL OCCULT BLD: NEGATIVE

## 2018-09-30 ENCOUNTER — Other Ambulatory Visit: Payer: Self-pay | Admitting: Internal Medicine

## 2018-09-30 DIAGNOSIS — Z1231 Encounter for screening mammogram for malignant neoplasm of breast: Secondary | ICD-10-CM

## 2018-10-28 ENCOUNTER — Ambulatory Visit
Admission: RE | Admit: 2018-10-28 | Discharge: 2018-10-28 | Disposition: A | Discharge status: Home / Self Care | Payer: BC Managed Care – PPO | Source: Ambulatory Visit | Attending: Internal Medicine | Admitting: Internal Medicine

## 2018-10-28 DIAGNOSIS — Z1231 Encounter for screening mammogram for malignant neoplasm of breast: Secondary | ICD-10-CM | POA: Diagnosis present

## 2019-05-11 ENCOUNTER — Encounter: Payer: BC Managed Care – PPO | Admitting: Obstetrics and Gynecology

## 2019-07-06 ENCOUNTER — Encounter: Payer: Self-pay | Admitting: Obstetrics and Gynecology

## 2019-07-06 ENCOUNTER — Ambulatory Visit (INDEPENDENT_AMBULATORY_CARE_PROVIDER_SITE_OTHER): Payer: BC Managed Care – PPO | Admitting: Obstetrics and Gynecology

## 2019-07-06 ENCOUNTER — Other Ambulatory Visit: Payer: Self-pay

## 2019-07-06 VITALS — BP 118/76 | HR 82 | Ht 66.0 in | Wt 216.6 lb

## 2019-07-06 DIAGNOSIS — Z01419 Encounter for gynecological examination (general) (routine) without abnormal findings: Secondary | ICD-10-CM

## 2019-07-06 NOTE — Progress Notes (Signed)
Patient comes in today for a physical. She has no concerns.today

## 2019-07-06 NOTE — Progress Notes (Signed)
HPI:      [name] is a 58 y.o. [phone] who LMP was No LMP recorded. Patient is postmenopausal.  Subjective:   She presents today for her annual examination.  Patient has no complaints.  She reports that she still occasionally gets up at night usually 2 times to urinate but this has not changed.  Denies menopausal symptoms.  Patient states that she no longer has the vulvar rash that she had last year.  Of significant note patient bumped her head in our bathroom and had a small amount of bleeding.  She reports no dizziness lightheadedness or other significant symptoms.SZP entered.    Hx: The following portions of the patient's history were reviewed and updated as appropriate:             She  has a past medical history of Acid reflux, AR (allergic rhinitis), Cystocele, HPV test positive, Hypercholesteremia, Increased BMI, Menopausal state, Rectocele, SUI (stress urinary incontinence, female), Uterine fibroid, Uterine prolapse, and Vaginal dryness. She does not have any pertinent problems on file. She  has a past surgical history that includes Cesarean section and Breast cyst aspiration (Left, 2005). Her family history includes Colon cancer in her maternal grandfather; Diabetes in her mother and paternal grandmother; Heart disease in her mother; Uterine cancer in her sister. She  reports that she has never smoked. She has never used smokeless tobacco. She reports current alcohol use. She reports that she does not use drugs. She has a current medication list which includes the following prescription(s): amlodipine, calcium-vitamin d, multiple vitamin, and ranitidine. She has No Known Allergies.       Review of Systems:  Review of Systems  Constitutional: Denied constitutional symptoms, night sweats, recent illness, fatigue, fever, insomnia and weight loss.  Eyes: Denied eye symptoms, eye pain, photophobia, vision change and visual disturbance.  Ears/Nose/Throat/Neck: Denied ear,  nose, throat or neck symptoms, hearing loss, nasal discharge, sinus congestion and sore throat.  Cardiovascular: Denied cardiovascular symptoms, arrhythmia, chest pain/pressure, edema, exercise intolerance, orthopnea and palpitations.  Respiratory: Denied pulmonary symptoms, asthma, pleuritic pain, productive sputum, cough, dyspnea and wheezing.  Gastrointestinal: Denied, gastro-esophageal reflux, melena, nausea and vomiting.  Genitourinary: Denied genitourinary symptoms including symptomatic vaginal discharge, pelvic relaxation issues, and urinary complaints.  Musculoskeletal: Denied musculoskeletal symptoms, stiffness, swelling, muscle weakness and myalgia.  Dermatologic: Denied dermatology symptoms, rash and scar.  Neurologic: Denied neurology symptoms, dizziness, headache, neck pain and syncope.  Psychiatric: Denied psychiatric symptoms, anxiety and depression.  Endocrine: Denied endocrine symptoms including hot flashes and night sweats.   Meds:   Current Outpatient Medications on File Prior to Visit  Medication Sig Dispense Refill  . amLODipine (NORVASC) 5 MG tablet Take by mouth.    . calcium-vitamin D 250-100 MG-UNIT tablet Take 1 tablet by mouth 2 (two) times daily.    . Multiple Vitamin (MULTI VITAMIN DAILY PO) Take by mouth.    . ranitidine (ZANTAC) 300 MG capsule Take by mouth.     No current facility-administered medications on file prior to visit.     Objective:     Vitals:   07/06/19 1013  BP: 118/76  Pulse: 82              Physical examination General NAD, Conversant  HEENT Exam reveals a superficial linear abrasion approximately 1.5 cm in length.  Hemostatic, edges are approximated as the wound is not deep.; Op clear with mmm.  Normo-cephalic. Pupils reactive. Anicteric sclerae  Thyroid/Neck Smooth without nodularity or  enlargement. Normal ROM.  Neck Supple.  Skin No rashes, lesions or ulceration. Normal palpated skin turgor. No nodularity.  Breasts: No masses or  discharge.  Symmetric.  No axillary adenopathy.  Lungs: Clear to auscultation.No rales or wheezes. Normal Respiratory effort, no retractions.  Heart: NSR.  No murmurs or rubs appreciated. No periferal edema  Abdomen: Soft.  Non-tender.  No masses.  No HSM. No hernia  Extremities: Moves all appropriately.  Normal ROM for age. No lymphadenopathy.  Neuro: Oriented to PPT.  Normal mood. Normal affect.     Pelvic:   Vulva: Normal appearance.  No lesions.  Vagina: No lesions or abnormalities noted.  Support:  Second-degree cystocele second-degree rectocele  Urethra No masses tenderness or scarring.  Meatus Normal size without lesions or prolapse.  Cervix: Normal appearance.  No lesions.  Anus: Normal exam.  No lesions.  Perineum: Normal exam.  No lesions.        Bimanual   Uterus: Normal size.  Non-tender.  Mobile.  AV.  Adnexae: No masses.  Non-tender to palpation.  Cul-de-sac: Negative for abnormality.   Exam limited by patient body habitus   Assessment:    G2P2002 Patient Active Problem List   Diagnosis Date Noted  . Obesity (BMI 30.0-34.9) 04/29/2017  . Constipation 04/29/2017     1. Well woman exam with routine gynecological exam     Normal exam   Plan:            1.  Basic Screening Recommendations The basic screening recommendations for asymptomatic women were discussed with the patient during her visit.  The age-appropriate recommendations were discussed with her and the rational for the tests reviewed.  When I am informed by the patient that another primary care physician has previously obtained the age-appropriate tests and they are up-to-date, only outstanding tests are ordered and referrals given as necessary.  Abnormal results of tests will be discussed with her when all of her results are completed. Patient states that she gets her mammogram and lab work through her PCP.  Pap due 2022 Orders No orders of the defined types were placed in this encounter.   No  orders of the defined types were placed in this encounter.       F/U  Return in about 1 year (around 07/05/2020) for Annual Physical.  Finis Bud, M.D. 07/06/2019 10:27 AM

## 2019-09-23 DIAGNOSIS — D239 Other benign neoplasm of skin, unspecified: Secondary | ICD-10-CM

## 2019-09-23 HISTORY — DX: Other benign neoplasm of skin, unspecified: D23.9

## 2019-09-27 DIAGNOSIS — K219 Gastro-esophageal reflux disease without esophagitis: Secondary | ICD-10-CM | POA: Insufficient documentation

## 2019-09-28 ENCOUNTER — Other Ambulatory Visit: Payer: Self-pay | Admitting: Internal Medicine

## 2019-09-28 DIAGNOSIS — Z1231 Encounter for screening mammogram for malignant neoplasm of breast: Secondary | ICD-10-CM

## 2019-11-02 ENCOUNTER — Ambulatory Visit
Admission: RE | Admit: 2019-11-02 | Discharge: 2019-11-02 | Disposition: A | Discharge status: Home / Self Care | Payer: BC Managed Care – PPO | Source: Ambulatory Visit | Attending: Internal Medicine | Admitting: Internal Medicine

## 2019-11-02 DIAGNOSIS — Z1231 Encounter for screening mammogram for malignant neoplasm of breast: Secondary | ICD-10-CM | POA: Insufficient documentation

## 2020-03-23 ENCOUNTER — Other Ambulatory Visit: Payer: Self-pay

## 2020-03-23 ENCOUNTER — Encounter: Payer: Self-pay | Admitting: Dermatology

## 2020-03-23 ENCOUNTER — Ambulatory Visit: Payer: Self-pay | Admitting: Dermatology

## 2020-03-23 DIAGNOSIS — L988 Other specified disorders of the skin and subcutaneous tissue: Secondary | ICD-10-CM

## 2020-03-23 DIAGNOSIS — L821 Other seborrheic keratosis: Secondary | ICD-10-CM

## 2020-03-23 NOTE — Progress Notes (Signed)
   Follow-Up Visit   Subjective  Kristy Greene is a 59 y.o. female who presents for the following: dark spots (Face and on finger).   The following portions of the chart were reviewed this encounter and updated as appropriate: Tobacco  Allergies  Meds  Problems  Med Hx  Surg Hx  Fam Hx      Review of Systems: No other skin or systemic complaints.  Objective  Well appearing patient in no apparent distress; mood and affect are within normal limits.  A focused examination was performed including face, R hand. Relevant physical exam findings are noted in the Assessment and Plan.  Objective  R 2nd finger x 1, L cheek x 4, R cheek x 3, R brow x 1 (9): And Lentigines Brown macules and stuck-on, waxy, tan-brown papules and plaques -- Discussed benign etiology and prognosis.   Images          Assessment & Plan  Elastosis of skin Head - Anterior (Face)  Patient purchased the Perfect A  Topical retinoid medications like tretinoin/Retin-A, adapalene/Differin, tazarotene/Fabior, and Epiduo/Epiduo Forte can cause dryness and irritation when first started. Only apply a pea-sized amount to the entire affected area. Avoid applying it around the eyes, edges of mouth and creases at the nose. If you experience irritation, use a good moisturizer first and/or apply the medicine less often. If you are doing well with the medicine, you can increase how often you use it until you are applying every night. Be careful with sun protection while using this medication as it can make you sensitive to the sun. This medicine should not be used by pregnant women.    Seborrheic keratosis (9) R 2nd finger x 1, L cheek x 4, R cheek x 3, R brow x 1  Cosmetic treatment. Patient signed non-covered consent.  Prior to procedure, discussed risks of blister formation, small wound, skin dyspigmentation, or rare scar following cryotherapy.    Destruction of lesion - R 2nd finger x 1, L cheek x 4, R  cheek x 3, R brow x 1  Destruction method: cryotherapy   Informed consent: discussed and consent obtained   Lesion destroyed using liquid nitrogen: Yes   Outcome: patient tolerated procedure well with no complications   Post-procedure details: wound care instructions given    Return in about 4 weeks (around 04/20/2020) for SK follow up.   Graciella Belton, RMA, am acting as scribe for Forest Gleason, MD .  Documentation: I have reviewed the above documentation for accuracy and completeness, and I agree with the above.  Forest Gleason, MD

## 2020-03-23 NOTE — Patient Instructions (Signed)
Cryotherapy Aftercare  . Wash gently with soap and water everyday.   . Apply Vaseline and Band-Aid daily until healed.  

## 2020-03-26 ENCOUNTER — Encounter: Payer: Self-pay | Admitting: Dermatology

## 2020-04-20 ENCOUNTER — Ambulatory Visit: Payer: BC Managed Care – PPO | Admitting: Dermatology

## 2020-04-20 ENCOUNTER — Encounter: Payer: Self-pay | Admitting: Dermatology

## 2020-04-20 ENCOUNTER — Other Ambulatory Visit: Payer: Self-pay

## 2020-04-20 DIAGNOSIS — L821 Other seborrheic keratosis: Secondary | ICD-10-CM

## 2020-04-20 NOTE — Patient Instructions (Signed)
Cryotherapy Aftercare  . Wash gently with soap and water everyday.   . Apply Vaseline and Band-Aid daily until healed.  

## 2020-04-20 NOTE — Progress Notes (Signed)
   Follow-Up Visit              Subjective    Kristy Greene is a 59 y.o. female who presents for the following: follow up of dark spots (Face and on finger) s/p LN2.  She notes some lightening but not complete resolution of the spots.  The following portions of the chart were reviewed this encounter and updated as appropriate: Tobacco  Allergies  Meds  Problems  Med Hx  Surg Hx  Fam Hx      Review of Systems: No other skin or systemic complaints.   Objective    Well appearing patient in no apparent distress; mood and affect are within normal limits.  A focused examination was performed including face, R hand. Relevant physical exam findings are noted in the Assessment and Plan.  Objective  R 2nd finger x 1, L cheek x 3, R cheek x 2, R brow x 1 (9): Brown macules and patches  Assessment & Plan   Seborrheic keratosis (7) R 2nd finger x 1, L cheek x 3, R cheek x 2, R brow x 1  Cosmetic treatment already paid for at last visit.  Prior to procedure, discussed risks of blister formation, small wound, skin dyspigmentation, or rare scar following cryotherapy.   Destruction of lesion - R 2nd finger x 1, L cheek x 3, R cheek x 2, R brow x 1  Destruction method: cryotherapy   Informed consent: discussed and consent obtained   Lesion destroyed using liquid nitrogen: Yes   Outcome: patient tolerated procedure well with no complications   Post-procedure details: wound care instructions given    Return in about 4 weeks for SK follow up (repeat cosmetic treatment if not resolved).  Forest Gleason, MD

## 2020-05-05 ENCOUNTER — Ambulatory Visit: Payer: BC Managed Care – PPO | Attending: Internal Medicine

## 2020-05-05 DIAGNOSIS — Z23 Encounter for immunization: Secondary | ICD-10-CM

## 2020-05-05 NOTE — Progress Notes (Signed)
   Covid-19 Vaccination Clinic  Name:  Kristy Greene    MRN: [id number] DOB: [birth year]  05/05/2020  [name] was observed post Covid-19 immunization for 15 minutes without incident. She was provided with Vaccine Information Sheet and instruction to access the V-Safe system.   [name] was instructed to call 911 with any severe reactions post vaccine: Marland Kitchen Difficulty breathing  . Swelling of face and throat  . A fast heartbeat  . A bad rash all over body  . Dizziness and weakness   Immunizations Administered    Name Date Dose VIS Date Route   Pfizer COVID-19 Vaccine 05/05/2020  8:43 AM 0.3 mL 02/23/2019 Intramuscular   Manufacturer: [place]   Lot: [id number]   [place]: [id number]

## 2020-05-11 ENCOUNTER — Ambulatory Visit: Payer: BC Managed Care – PPO | Admitting: Dermatology

## 2020-05-30 ENCOUNTER — Ambulatory Visit: Payer: BC Managed Care – PPO | Attending: Internal Medicine

## 2020-05-30 DIAGNOSIS — Z23 Encounter for immunization: Secondary | ICD-10-CM

## 2020-05-30 NOTE — Progress Notes (Signed)
   Covid-19 Vaccination Clinic  Name:  Kristy Greene    MRN: [id number] DOB: 1961-05-15  05/30/2020  [name] was observed post Covid-19 immunization for 15 minutes without incident. She was provided with Vaccine Information Sheet and instruction to access the V-Safe system.   [name] was instructed to call 911 with any severe reactions post vaccine: Marland Kitchen Difficulty breathing  . Swelling of face and throat  . A fast heartbeat  . A bad rash all over body  . Dizziness and weakness   Immunizations Administered    Name Date Dose VIS Date Route   Pfizer COVID-19 Vaccine 05/30/2020  2:59 PM 0.3 mL 02/23/2019 Intramuscular   Manufacturer: [place]   Lot: [id number]   [place]: [id number]

## 2020-06-13 ENCOUNTER — Ambulatory Visit: Payer: BC Managed Care – PPO | Admitting: Dermatology

## 2020-07-06 ENCOUNTER — Ambulatory Visit (INDEPENDENT_AMBULATORY_CARE_PROVIDER_SITE_OTHER): Payer: BC Managed Care – PPO | Admitting: Obstetrics and Gynecology

## 2020-07-06 ENCOUNTER — Encounter: Payer: Self-pay | Admitting: Obstetrics and Gynecology

## 2020-07-06 ENCOUNTER — Other Ambulatory Visit: Payer: Self-pay

## 2020-07-06 VITALS — BP 133/84 | HR 75 | Ht 66.0 in | Wt 217.4 lb

## 2020-07-06 DIAGNOSIS — N8189 Other female genital prolapse: Secondary | ICD-10-CM | POA: Diagnosis not present

## 2020-07-06 DIAGNOSIS — Z01419 Encounter for gynecological examination (general) (routine) without abnormal findings: Secondary | ICD-10-CM

## 2020-07-06 NOTE — Progress Notes (Signed)
HPI:      [name] is a 59 y.o. [phone] who LMP was No LMP recorded. Patient is postmenopausal.  Subjective:   She presents today for her annual examination.  She has no complaints today.  Reports that she is doing well.  Lab work and mammogram through her PCP. Of significant note patient has a history of cystocele and rectocele but she states this is no worse than in the past. Patient has a remote history of HPV but no recent issues.    Hx: The following portions of the patient's history were reviewed and updated as appropriate:             She  has a past medical history of Acid reflux, Actinic keratosis, AR (allergic rhinitis), Cystocele, Dysplastic nevus, HPV test positive, Hypercholesteremia, Increased BMI, Menopausal state, Rectocele, SUI (stress urinary incontinence, female), Uterine fibroid, Uterine prolapse, and Vaginal dryness. She does not have any pertinent problems on file. She  has a past surgical history that includes Cesarean section and Breast cyst aspiration (Left, 2005). Her family history includes Colon cancer in her maternal grandfather; Diabetes in her mother and paternal grandmother; Heart disease in her mother; Uterine cancer in her sister. She  reports that she has never smoked. She has never used smokeless tobacco. She reports current alcohol use. She reports that she does not use drugs. She has a current medication list which includes the following prescription(s): amlodipine, calcium-vitamin d, latanoprost, multiple vitamin, and pantoprazole. She has No Known Allergies.       Review of Systems:  Review of Systems  Constitutional: Denied constitutional symptoms, night sweats, recent illness, fatigue, fever, insomnia and weight loss.  Eyes: Denied eye symptoms, eye pain, photophobia, vision change and visual disturbance.  Ears/Nose/Throat/Neck: Denied ear, nose, throat or neck symptoms, hearing loss, nasal discharge, sinus congestion and sore throat.   Cardiovascular: Denied cardiovascular symptoms, arrhythmia, chest pain/pressure, edema, exercise intolerance, orthopnea and palpitations.  Respiratory: Denied pulmonary symptoms, asthma, pleuritic pain, productive sputum, cough, dyspnea and wheezing.  Gastrointestinal: Denied, gastro-esophageal reflux, melena, nausea and vomiting.  Genitourinary: Denied genitourinary symptoms including symptomatic vaginal discharge, pelvic relaxation issues, and urinary complaints.  Musculoskeletal: Denied musculoskeletal symptoms, stiffness, swelling, muscle weakness and myalgia.  Dermatologic: Denied dermatology symptoms, rash and scar.  Neurologic: Denied neurology symptoms, dizziness, headache, neck pain and syncope.  Psychiatric: Denied psychiatric symptoms, anxiety and depression.  Endocrine: Denied endocrine symptoms including hot flashes and night sweats.   Meds:   Current Outpatient Medications on File Prior to Visit  Medication Sig Dispense Refill   amLODipine (NORVASC) 5 MG tablet Take by mouth.     calcium-vitamin D 250-100 MG-UNIT tablet Take 1 tablet by mouth 2 (two) times daily.     latanoprost (XALATAN) 0.005 % ophthalmic solution Apply to eye.     Multiple Vitamin (MULTI VITAMIN DAILY PO) Take by mouth.     pantoprazole (PROTONIX) 40 MG tablet      No current facility-administered medications on file prior to visit.    Objective:     Vitals:   07/06/20 0854  BP: 133/84  Pulse: 75              Physical examination General NAD, Conversant  HEENT Atraumatic; Op clear with mmm.  Normo-cephalic. Pupils reactive. Anicteric sclerae  Thyroid/Neck Smooth without nodularity or enlargement. Normal ROM.  Neck Supple.  Skin No rashes, lesions or ulceration. Normal palpated skin turgor. No nodularity.  Breasts: No masses or discharge.  Symmetric.  No axillary adenopathy.  Lungs: Clear to auscultation.No rales or wheezes. Normal Respiratory effort, no retractions.  Heart: NSR.  No  murmurs or rubs appreciated. No periferal edema  Abdomen: Soft.  Non-tender.  No masses.  No HSM. No hernia  Extremities: Moves all appropriately.  Normal ROM for age. No lymphadenopathy.  Neuro: Oriented to PPT.  Normal mood. Normal affect.     Pelvic:   Vulva: Normal appearance.  No lesions.  Vagina: No lesions or abnormalities noted.  Support:  Second-degree cystocele second-degree rectocele  Urethra No masses tenderness or scarring.  Meatus Normal size without lesions or prolapse.  Cervix: Normal appearance.  No lesions.  Anus: Normal exam.  No lesions.  Perineum: Normal exam.  No lesions.        Bimanual   Uterus: Normal size.  Non-tender.  Mobile.  AV.  Adnexae: No masses.  Non-tender to palpation.  Cul-de-sac: Negative for abnormality.      Assessment:    [id number] Patient Active Problem List   Diagnosis Date Noted   Obesity (BMI 30.0-34.9) 04/29/2017   Constipation 04/29/2017     1. Well woman exam with routine gynecological exam   2. Pelvic relaxation     Pelvic relaxation stable at this time.   Plan:            1.  Basic Screening Recommendations The basic screening recommendations for asymptomatic women were discussed with the patient during her visit.  The age-appropriate recommendations were discussed with her and the rational for the tests reviewed.  When I am informed by the patient that another primary care physician has previously obtained the age-appropriate tests and they are up-to-date, only outstanding tests are ordered and referrals given as necessary.  Abnormal results of tests will be discussed with her when all of her results are completed.  Routine preventative health maintenance measures emphasized: Exercise/Diet/Weight control, Tobacco Warnings, Alcohol/Substance use risks and Stress Management Recommend Pap cotest next year. Orders No orders of the defined types were placed in this encounter.   No orders of the defined types were placed in  this encounter.       F/U  No follow-ups on file.  Finis Bud, M.D. 07/06/2020 9:20 AM

## 2020-09-25 ENCOUNTER — Other Ambulatory Visit: Payer: Self-pay | Admitting: Internal Medicine

## 2020-09-25 DIAGNOSIS — Z1231 Encounter for screening mammogram for malignant neoplasm of breast: Secondary | ICD-10-CM

## 2020-11-02 ENCOUNTER — Ambulatory Visit
Admission: RE | Admit: 2020-11-02 | Discharge: 2020-11-02 | Disposition: A | Discharge status: Home / Self Care | Payer: BC Managed Care – PPO | Source: Ambulatory Visit | Attending: Internal Medicine | Admitting: Internal Medicine

## 2020-11-02 ENCOUNTER — Other Ambulatory Visit: Payer: Self-pay

## 2020-11-02 DIAGNOSIS — Z1231 Encounter for screening mammogram for malignant neoplasm of breast: Secondary | ICD-10-CM | POA: Insufficient documentation

## 2021-01-17 ENCOUNTER — Encounter: Payer: Self-pay | Admitting: Dermatology

## 2021-01-17 ENCOUNTER — Other Ambulatory Visit: Payer: Self-pay

## 2021-01-17 ENCOUNTER — Ambulatory Visit: Payer: BC Managed Care – PPO | Admitting: Dermatology

## 2021-01-17 DIAGNOSIS — L570 Actinic keratosis: Secondary | ICD-10-CM

## 2021-01-17 DIAGNOSIS — D485 Neoplasm of uncertain behavior of skin: Secondary | ICD-10-CM | POA: Diagnosis not present

## 2021-01-17 DIAGNOSIS — L821 Other seborrheic keratosis: Secondary | ICD-10-CM

## 2021-01-17 DIAGNOSIS — L578 Other skin changes due to chronic exposure to nonionizing radiation: Secondary | ICD-10-CM

## 2021-01-17 DIAGNOSIS — D229 Melanocytic nevi, unspecified: Secondary | ICD-10-CM

## 2021-01-17 DIAGNOSIS — L304 Erythema intertrigo: Secondary | ICD-10-CM | POA: Diagnosis not present

## 2021-01-17 DIAGNOSIS — L814 Other melanin hyperpigmentation: Secondary | ICD-10-CM

## 2021-01-17 DIAGNOSIS — Z86018 Personal history of other benign neoplasm: Secondary | ICD-10-CM

## 2021-01-17 DIAGNOSIS — Z1283 Encounter for screening for malignant neoplasm of skin: Secondary | ICD-10-CM

## 2021-01-17 DIAGNOSIS — D1800 Hemangioma unspecified site: Secondary | ICD-10-CM

## 2021-01-17 MED ORDER — HYDROCORTISONE 2.5 % EX CREA: TOPICAL_CREAM | Freq: Two times a day (BID) | CUTANEOUS | 1 refills | Status: DC | PRN

## 2021-01-17 MED ORDER — KETOCONAZOLE 2 % EX CREA: 1.0000 "application " | TOPICAL_CREAM | Freq: Two times a day (BID) | CUTANEOUS | 1 refills | Status: AC

## 2021-01-17 NOTE — Patient Instructions (Addendum)
Melanoma ABCDEs  Melanoma is the most dangerous type of skin cancer, and is the leading cause of death from skin disease.  You are more likely to develop melanoma if you:  Have light-colored skin, light-colored eyes, or red or blond hair  Spend a lot of time in the sun  Tan regularly, either outdoors or in a tanning bed  Have had blistering sunburns, especially during childhood  Have a close family member who has had a melanoma  Have atypical moles or large birthmarks  Early detection of melanoma is key since treatment is typically straightforward and cure rates are extremely high if we catch it early.   The first sign of melanoma is often a change in a mole or a new dark spot.  The ABCDE system is a way of remembering the signs of melanoma.  A for asymmetry:  The two halves do not match. B for border:  The edges of the growth are irregular. C for color:  A mixture of colors are present instead of an even brown color. D for diameter:  Melanomas are usually (but not always) greater than 88mm - the size of a pencil eraser. E for evolution:  The spot keeps changing in size, shape, and color.  Please check your skin once per month between visits. You can use a small mirror in front and a large mirror behind you to keep an eye on the back side or your body.   If you see any new or changing lesions before your next follow-up, please call to schedule a visit.  Please continue daily skin protection including broad spectrum sunscreen SPF 30+ to sun-exposed areas, reapplying every 2 hours as needed when you're outdoors.   Cryotherapy Aftercare  . Wash gently with soap and water everyday.   Marland Kitchen Apply Vaseline and Band-Aid daily until healed.  Prior to procedure, discussed risks of blister formation, small wound, skin dyspigmentation, or rare scar following cryotherapy.   Recommend taking Heliocare sun protection supplement daily in sunny weather for additional sun protection. For maximum  protection on the sunniest days, you can take up to 2 capsules of regular Heliocare OR take 1 capsule of Heliocare Ultra. For prolonged exposure (such as a full day in the sun), you can repeat your dose of the supplement 4 hours after your first dose. Heliocare can be purchased at [facility] or at [email or web address].   Apply ammonium lactate to feet nightly and vaseline to open areas.

## 2021-01-17 NOTE — Progress Notes (Signed)
Follow-Up Visit   Subjective  Kristy Greene is a 60 y.o. female who presents for the following: TBSE (Patient here today for FBSE and skin cancer screening. She has a history of dysplastic nevus. Patient not aware of anything new or changing that she is aware of. ).  The following portions of the chart were reviewed this encounter and updated as appropriate:   Tobacco  Allergies  Meds  Problems  Med Hx  Surg Hx  Fam Hx      Review of Systems:  No other skin or systemic complaints except as noted in HPI or Assessment and Plan.  Objective  Well appearing patient in no apparent distress; mood and affect are within normal limits.  A full examination was performed including scalp, head, eyes, ears, nose, lips, neck, chest, axillae, abdomen, back, buttocks, bilateral upper extremities, bilateral lower extremities, hands, feet, fingers, toes, fingernails, and toenails. All findings within normal limits unless otherwise noted below.  Objective  R Upper Arm x 4, L forearm x 1, L dorsal hand x 1, L upper arm x 2, L shoulder x 1, R upper thigh x 5, R popliteal fossa x 1, R sup post calf x 1 (16): Erythematous thin papules/macules with gritty scale.  Hypertrophic at left forearm.  Objective  Left Abdomen (side) - Lower: Macerated red patch  Objective  bilateral feet: Healing excoriations vs AK with background scale right > left   Assessment & Plan  AK (actinic keratosis) (16) R Upper Arm x 4, L forearm x 1, L dorsal hand x 1, L upper arm x 2, L shoulder x 1, R upper thigh x 5, R popliteal fossa x 1, R sup post calf x 1  Discussed 5-fluorouracil/calcipotriene vs cryotherapy. Patient prefers treatment with cryotherapy today over topical cream.  Prior to procedure, discussed risks of blister formation, small wound, skin dyspigmentation, or rare scar following cryotherapy.   Consider biopsy at left forearm at follow-up if not resolved and if indicated.     Destruction of  lesion - R Upper Arm x 4, L forearm x 1, L dorsal hand x 1, L upper arm x 2, L shoulder x 1, R upper thigh x 5, R popliteal fossa x 1, R sup post calf x 1 Complexity: simple   Destruction method: cryotherapy   Informed consent: discussed and consent obtained   Lesion destroyed using liquid nitrogen: Yes   Cryotherapy cycles:  2 Outcome: patient tolerated procedure well with no complications   Post-procedure details: wound care instructions given    Erythema intertrigo Left Abdomen (side) - Lower  Start ketoconazole 2% cream and HC 2.5% cream twice daily for up to 2 weeks as needed for rash. Recommend Zeasorb powder.   Topical steroids (such as triamcinolone, fluocinolone, fluocinonide, mometasone, clobetasol, halobetasol, betamethasone, hydrocortisone) can cause thinning and lightening of the skin if they are used for too long in the same area. Your physician has selected the right strength medicine for your problem and area affected on the body. Please use your medication only as directed by your physician to prevent side effects.    Ordered Medications: ketoconazole (NIZORAL) 2 % cream hydrocortisone 2.5 % cream  Neoplasm of uncertain behavior of skin bilateral feet  Recommend AmLactin to break down scale and vaseline to crusted areas. Recheck on follow up.    Lentigines - Scattered tan macules - Discussed due to sun exposure - Benign, observe - Call for any changes  Seborrheic Keratoses - Stuck-on, waxy, tan-brown  papules and plaques  - Discussed benign etiology and prognosis. - Observe - Call for any changes  Melanocytic Nevi - Tan-brown and/or pink-flesh-colored symmetric macules and papules - Benign appearing on exam today - Observation - Call clinic for new or changing moles - Recommend daily use of broad spectrum spf 30+ sunscreen to sun-exposed areas.   Hemangiomas - Red papules - Discussed benign nature - Observe - Call for any changes  Actinic Damage -  Severe, chronic, secondary to cumulative UV radiation exposure over time - diffuse scaly erythematous macules and papules with underlying dyspigmentation - Discussed Prescription "Field Treatment" for Severe, Chronic Confluent Actinic Changes with Pre-Cancerous Actinic Keratoses Field treatment involves treatment of an entire area of skin that has confluent Actinic Changes (Sun/ Ultraviolet light damage) and PreCancerous Actinic Keratoses by method of PhotoDynamic Therapy (PDT) and/or prescription Topical Chemotherapy agents such as 5-fluorouracil, 5-fluorouracil/calcipotriene, and/or imiquimod.  The purpose is to decrease the number of clinically evident and subclinical PreCancerous lesions to prevent progression to development of skin cancer by chemically destroying early precancer changes that may or may not be visible.  It has been shown to reduce the risk of developing skin cancer in the treated area. As a result of treatment, redness, scaling, crusting, and open sores may occur during treatment course. One or more than one of these methods may be used and may have to be used several times to control, suppress and eliminate the PreCancerous changes. Discussed treatment course, expected reaction, and possible side effects. - Recommend daily broad spectrum sunscreen SPF 30+ to sun-exposed areas, reapply every 2 hours as needed.  - Call for new or changing lesions. - Deferred today. Will monitor.   Skin cancer screening performed today.  History of Dysplastic Nevi - No evidence of recurrence today at right lower back, moderate - Recommend regular full body skin exams - Recommend daily broad spectrum sunscreen SPF 30+ to sun-exposed areas, reapply every 2 hours as needed.  - Call if any new or changing lesions are noted between office visits  Return in about 2 months (around 03/17/2021) for AK follow up, recheck feet.  Graciella Belton, RMA, am acting as scribe for Forest Gleason, MD  .  Documentation: I have reviewed the above documentation for accuracy and completeness, and I agree with the above.  Forest Gleason, MD

## 2021-03-21 ENCOUNTER — Other Ambulatory Visit: Payer: Self-pay

## 2021-03-21 ENCOUNTER — Ambulatory Visit: Payer: BC Managed Care – PPO | Admitting: Dermatology

## 2021-03-21 DIAGNOSIS — L578 Other skin changes due to chronic exposure to nonionizing radiation: Secondary | ICD-10-CM

## 2021-03-21 DIAGNOSIS — L570 Actinic keratosis: Secondary | ICD-10-CM

## 2021-03-21 NOTE — Progress Notes (Signed)
   Follow-Up Visit   Subjective  Kristy Greene is a 60 y.o. female who presents for the following: Follow-up (Patient here today for 2 month AK follow up at arms, hands, shoulders and legs, recheck spot at left forearm. Patient also to have excoriations vs AK rechecked at bilateral feet. Not bothersome for patient. ).  The following portions of the chart were reviewed this encounter and updated as appropriate:   Tobacco  Allergies  Meds  Problems  Med Hx  Surg Hx  Fam Hx      Review of Systems:  No other skin or systemic complaints except as noted in HPI or Assessment and Plan.  Objective  Well appearing patient in no apparent distress; mood and affect are within normal limits.  A focused examination was performed including arms, legs, hands, feet. Relevant physical exam findings are noted in the Assessment and Plan.  Objective  Left dorsal foot x 5, right lateral foot x 3, left upper arm x 3, residual at right post calf x 1 (12): Erythematous thin papules/macules with gritty scale.    Assessment & Plan  AK (actinic keratosis) (12) Left dorsal foot x 5, right lateral foot x 3, left upper arm x 3, residual at right post calf x 1  Prior to procedure, discussed risks of blister formation, small wound, skin dyspigmentation, or rare scar following cryotherapy.    Destruction of lesion - Left dorsal foot x 5, right lateral foot x 3, left upper arm x 3, residual at right post calf x 1  Destruction method: cryotherapy   Informed consent: discussed and consent obtained   Lesion destroyed using liquid nitrogen: Yes   Cryotherapy cycles:  2 Outcome: patient tolerated procedure well with no complications   Post-procedure details: wound care instructions given    Actinic Damage - chronic, secondary to cumulative UV radiation exposure/sun exposure over time - diffuse scaly erythematous macules with underlying dyspigmentation - Recommend daily broad spectrum sunscreen SPF 30+ to  sun-exposed areas, reapply every 2 hours as needed.  - Recommend staying in the shade or wearing long sleeves, sun glasses (UVA+UVB protection) and wide brim hats (4-inch brim around the entire circumference of the hat). - Call for new or changing lesions.   Return in about 3 months (around 06/21/2021) for AK follow up and 9 month for FBSE.  Graciella Belton, RMA, am acting as scribe for Forest Gleason, MD .  Documentation: I have reviewed the above documentation for accuracy and completeness, and I agree with the above.  Forest Gleason, MD

## 2021-03-21 NOTE — Patient Instructions (Addendum)
If you have any questions or concerns for your doctor, please call our main line at [phone] and press option 4 to reach your doctor's medical assistant. If no one answers, please leave a voicemail as directed and we will return your call as soon as possible. Messages left after 4 pm will be answered the following business day.   You may also send [place] a message via [place]. We typically respond to MyChart messages within 1-2 business days.  For prescription refills, please ask your pharmacy to contact our office. Our fax number is [phone].  If you have an urgent issue when the clinic is closed that cannot wait until the next business day, you can page your doctor at the number below.    Please note that while we do our best to be available for urgent issues outside of office hours, we are not available 24/7.   If you have an urgent issue and are unable to reach [place], you may choose to seek medical care at your doctor's office, retail clinic, urgent care center, or emergency room.  If you have a medical emergency, please immediately call 911 or go to the emergency department.  Pager Numbers  - Dr. Nehemiah Massed: [phone]  - Dr. Laurence Ferrari: [phone]  - Dr. Nicole Kindred: [phone]  In the event of inclement weather, please call our main line at [phone] for an update on the status of any delays or closures.  Dermatology Medication Tips: Please keep the boxes that topical medications come in in order to help keep track of the instructions about where and how to use these. Pharmacies typically print the medication instructions only on the boxes and not directly on the medication tubes.   If your medication is too expensive, please contact our office at [phone] option 4 or send [place] a message through [place].   We are unable to tell what your co-pay for medications will be in advance as this is different depending on your insurance coverage. However, we may be able to find a substitute  medication at lower cost or fill out paperwork to get insurance to cover a needed medication.   If a prior authorization is required to get your medication covered by your insurance company, please allow [place] 1-2 business days to complete this process.  Drug prices often vary depending on where the prescription is filled and some pharmacies may offer cheaper prices.  The website www.goodrx.com contains coupons for medications through different pharmacies. The prices here do not account for what the cost may be with help from insurance (it may be cheaper with your insurance), but the website can give you the price if you did not use any insurance.  - You can print the associated coupon and take it with your prescription to the pharmacy.  - You may also stop by our office during regular business hours and pick up a GoodRx coupon card.  - If you need your prescription sent electronically to a different pharmacy, notify our office through [facility] or by phone at [phone] option 4.   Cryotherapy Aftercare  . Wash gently with soap and water everyday.   Marland Kitchen Apply Vaseline and Band-Aid daily until healed.  Prior to procedure, discussed risks of blister formation, small wound, skin dyspigmentation, or rare scar following cryotherapy.    Recommend Serica moisturizing scar formula cream every night or [facility] brand or Mederma silicone scar sheet every night for the first year after a scar appears to help with scar remodeling if  desired. Scars remodel on their own for a full year.

## 2021-03-22 ENCOUNTER — Encounter: Payer: Self-pay | Admitting: Dermatology

## 2021-06-28 ENCOUNTER — Ambulatory Visit: Payer: BC Managed Care – PPO | Admitting: Dermatology

## 2021-07-16 ENCOUNTER — Other Ambulatory Visit (HOSPITAL_COMMUNITY)
Admission: RE | Admit: 2021-07-16 | Discharge: 2021-07-16 | Disposition: A | Discharge status: Home / Self Care | Payer: BC Managed Care – PPO | Source: Ambulatory Visit | Attending: Obstetrics and Gynecology | Admitting: Obstetrics and Gynecology

## 2021-07-16 ENCOUNTER — Encounter: Payer: Self-pay | Admitting: Obstetrics and Gynecology

## 2021-07-16 ENCOUNTER — Ambulatory Visit (INDEPENDENT_AMBULATORY_CARE_PROVIDER_SITE_OTHER): Payer: BC Managed Care – PPO | Admitting: Obstetrics and Gynecology

## 2021-07-16 ENCOUNTER — Other Ambulatory Visit: Payer: Self-pay

## 2021-07-16 VITALS — BP 129/74 | HR 65 | Ht 66.0 in | Wt 210.6 lb

## 2021-07-16 DIAGNOSIS — Z01419 Encounter for gynecological examination (general) (routine) without abnormal findings: Secondary | ICD-10-CM

## 2021-07-16 DIAGNOSIS — Z124 Encounter for screening for malignant neoplasm of cervix: Secondary | ICD-10-CM

## 2021-07-16 DIAGNOSIS — N8189 Other female genital prolapse: Secondary | ICD-10-CM

## 2021-07-16 NOTE — Addendum Note (Signed)
Addended by: Durwin Glaze on: 07/16/2021 10:35 AM   Modules accepted: Orders

## 2021-07-16 NOTE — Progress Notes (Signed)
HPI:      [name] is a 60 y.o. [phone] who LMP was No LMP recorded. Patient is postmenopausal.  Subjective:   She presents today for her annual examination.  She has no complaints. She has a remote history of HPV.    Hx: The following portions of the patient's history were reviewed and updated as appropriate:             She  has a past medical history of Acid reflux, Actinic keratosis, AR (allergic rhinitis), Cystocele, Dysplastic nevus (09/23/2019), HPV test positive, Hypercholesteremia, Increased BMI, Menopausal state, Rectocele, SUI (stress urinary incontinence, female), Uterine fibroid, Uterine prolapse, and Vaginal dryness. She does not have any pertinent problems on file. She  has a past surgical history that includes Cesarean section and Breast cyst aspiration (Left, 2005). Her family history includes Colon cancer in her maternal grandfather; Diabetes in her mother and paternal grandmother; Heart disease in her mother; Uterine cancer in her sister. She  reports that she has never smoked. She has never used smokeless tobacco. She reports current alcohol use. She reports that she does not use drugs. She has a current medication list which includes the following prescription(s): amlodipine, calcium-vitamin d, famotidine, latanoprost, multiple vitamin, and pantoprazole. She has No Known Allergies.       Review of Systems:  Review of Systems  Constitutional: Denied constitutional symptoms, night sweats, recent illness, fatigue, fever, insomnia and weight loss.  Eyes: Denied eye symptoms, eye pain, photophobia, vision change and visual disturbance.  Ears/Nose/Throat/Neck: Denied ear, nose, throat or neck symptoms, hearing loss, nasal discharge, sinus congestion and sore throat.  Cardiovascular: Denied cardiovascular symptoms, arrhythmia, chest pain/pressure, edema, exercise intolerance, orthopnea and palpitations.  Respiratory: Denied pulmonary symptoms, asthma, pleuritic  pain, productive sputum, cough, dyspnea and wheezing.  Gastrointestinal: Denied, gastro-esophageal reflux, melena, nausea and vomiting.  Genitourinary: Denied genitourinary symptoms including symptomatic vaginal discharge, pelvic relaxation issues, and urinary complaints.  Musculoskeletal: Denied musculoskeletal symptoms, stiffness, swelling, muscle weakness and myalgia.  Dermatologic: Denied dermatology symptoms, rash and scar.  Neurologic: Denied neurology symptoms, dizziness, headache, neck pain and syncope.  Psychiatric: Denied psychiatric symptoms, anxiety and depression.  Endocrine: Denied endocrine symptoms including hot flashes and night sweats.   Meds:   Current Outpatient Medications on File Prior to Visit  Medication Sig Dispense Refill   amLODipine (NORVASC) 5 MG tablet Take by mouth.     calcium-vitamin D 250-100 MG-UNIT tablet Take 1 tablet by mouth 2 (two) times daily.     famotidine (PEPCID) 40 MG tablet      latanoprost (XALATAN) 0.005 % ophthalmic solution Apply to eye.     Multiple Vitamin (MULTI VITAMIN DAILY PO) Take by mouth.     pantoprazole (PROTONIX) 40 MG tablet      No current facility-administered medications on file prior to visit.       Objective:     Vitals:   07/16/21 0947  BP: 129/74  Pulse: 65    Filed Weights   07/16/21 0947  Weight: 210 lb 9.6 oz (95.5 kg)              Physical examination General NAD, Conversant  HEENT Atraumatic; Op clear with mmm.  Normo-cephalic. Pupils reactive. Anicteric sclerae  Thyroid/Neck Smooth without nodularity or enlargement. Normal ROM.  Neck Supple.  Skin No rashes, lesions or ulceration. Normal palpated skin turgor. No nodularity.  Breasts: No masses or discharge.  Symmetric.  No axillary adenopathy.  Lungs: Clear to auscultation.No rales  or wheezes. Normal Respiratory effort, no retractions.  Heart: NSR.  No murmurs or rubs appreciated. No periferal edema  Abdomen: Soft.  Non-tender.  No masses.  No  HSM. No hernia  Extremities: Moves all appropriately.  Normal ROM for age. No lymphadenopathy.  Neuro: Oriented to PPT.  Normal mood. Normal affect.     Pelvic:   Vulva: Normal appearance.  No lesions.  Vagina: No lesions or abnormalities noted.  Support: Second-degree uterine prolapse with associated cystocele/rectocele  Urethra No masses tenderness or scarring.  Meatus Normal size without lesions or prolapse.  Cervix: Normal appearance.  No lesions.  Anus: Normal exam.  No lesions.  Perineum: Normal exam.  No lesions.        Bimanual   Uterus: Normal size.  Non-tender.  Mobile.  AV.  Adnexae: No masses.  Non-tender to palpation.  Cul-de-sac: Negative for abnormality.     Assessment:    [id number] Patient Active Problem List   Diagnosis Date Noted   Obesity (BMI 30.0-34.9) 04/29/2017   Constipation 04/29/2017     1. Well woman exam with routine gynecological exam   2. Pelvic relaxation disorder        Plan:            1.  Basic Screening Recommendations The basic screening recommendations for asymptomatic women were discussed with the patient during her visit.  The age-appropriate recommendations were discussed with her and the rational for the tests reviewed.  When I am informed by the patient that another primary care physician has previously obtained the age-appropriate tests and they are up-to-date, only outstanding tests are ordered and referrals given as necessary.  Abnormal results of tests will be discussed with her when all of her results are completed.  Routine preventative health maintenance measures emphasized: Exercise/Diet/Weight control, Tobacco Warnings, Alcohol/Substance use risks and Stress Management Pap performed  Patient otherwise up-to-date on mammography and lab work. Orders No orders of the defined types were placed in this encounter.   No orders of the defined types were placed in this encounter.         F/U  Return in about 1 year (around  07/16/2022) for Annual Physical.  Finis Bud, M.D. 07/16/2021 10:14 AM

## 2021-07-18 LAB — CYTOLOGY - PAP
Comment: NEGATIVE
Diagnosis: NEGATIVE
High risk HPV: NEGATIVE

## 2021-07-26 ENCOUNTER — Ambulatory Visit: Payer: BC Managed Care – PPO | Admitting: Dermatology

## 2021-07-26 ENCOUNTER — Other Ambulatory Visit: Payer: Self-pay

## 2021-07-26 DIAGNOSIS — L570 Actinic keratosis: Secondary | ICD-10-CM

## 2021-07-26 DIAGNOSIS — L821 Other seborrheic keratosis: Secondary | ICD-10-CM | POA: Diagnosis not present

## 2021-07-26 DIAGNOSIS — L578 Other skin changes due to chronic exposure to nonionizing radiation: Secondary | ICD-10-CM | POA: Diagnosis not present

## 2021-07-26 NOTE — Progress Notes (Deleted)
   Follow-Up Visit   Subjective  Kristy Greene is a 60 y.o. female who presents for the following: No chief complaint on file..  ***  The following portions of the chart were reviewed this encounter and updated as appropriate:       Review of Systems:  No other skin or systemic complaints except as noted in HPI or Assessment and Plan.  Objective  Well appearing patient in no apparent distress; mood and affect are within normal limits.  [id number] full examination was performed including scalp, head, eyes, ears, nose, lips, neck, chest, axillae, abdomen, back, buttocks, bilateral upper extremities, bilateral lower extremities, hands, feet, fingers, toes, fingernails, and toenails. All findings within normal limits unless otherwise noted below."}    Assessment & Plan   No follow-ups on file.

## 2021-07-26 NOTE — Patient Instructions (Addendum)
Cryotherapy Aftercare  Wash gently with soap and water everyday.   Apply Vaseline and Band-Aid daily until healed.   Prior to procedure, discussed risks of blister formation, small wound, skin dyspigmentation, or rare scar following cryotherapy. Recommend Vaseline ointment to treated areas while healing.  Recommend taking Heliocare sun protection supplement daily in sunny weather for additional sun protection. For maximum protection on the sunniest days, you can take up to 2 capsules of regular Heliocare OR take 1 capsule of Heliocare Ultra. For prolonged exposure (such as a full day in the sun), you can repeat your dose of the supplement 4 hours after your first dose. Heliocare can be purchased at [facility] or at [email or web address].    Melanoma ABCDEs  Melanoma is the most dangerous type of skin cancer, and is the leading cause of death from skin disease.  You are more likely to develop melanoma if you: Have light-colored skin, light-colored eyes, or red or blond hair Spend a lot of time in the sun Tan regularly, either outdoors or in a tanning bed Have had blistering sunburns, especially during childhood Have a close family member who has had a melanoma Have atypical moles or large birthmarks  Early detection of melanoma is key since treatment is typically straightforward and cure rates are extremely high if we catch it early.   The first sign of melanoma is often a change in a mole or a new dark spot.  The ABCDE system is a way of remembering the signs of melanoma.  A for asymmetry:  The two halves do not match. B for border:  The edges of the growth are irregular. C for color:  A mixture of colors are present instead of an even brown color. D for diameter:  Melanomas are usually (but not always) greater than 37mm - the size of a pencil eraser. E for evolution:  The spot keeps changing in size, shape, and color.  Please check your skin once per month between visits. You can  use a small mirror in front and a large mirror behind you to keep an eye on the back side or your body.   If you see any new or changing lesions before your next follow-up, please call to schedule a visit.  Please continue daily skin protection including broad spectrum sunscreen SPF 30+ to sun-exposed areas, reapplying every 2 hours as needed when you're outdoors.    If you have any questions or concerns for your doctor, please call our main line at [phone] and press option 4 to reach your doctor's medical assistant. If no one answers, please leave a voicemail as directed and we will return your call as soon as possible. Messages left after 4 pm will be answered the following business day.   You may also send [place] a message via [place]. We typically respond to MyChart messages within 1-2 business days.  For prescription refills, please ask your pharmacy to contact our office. Our fax number is [phone].  If you have an urgent issue when the clinic is closed that cannot wait until the next business day, you can page your doctor at the number below.    Please note that while we do our best to be available for urgent issues outside of office hours, we are not available 24/7.   If you have an urgent issue and are unable to reach [place], you may choose to seek medical care at your doctor's office, retail clinic, urgent care center, or emergency room.  If you have a medical emergency, please immediately call 911 or go to the emergency department.  Pager Numbers  - Dr. Nehemiah Massed: [phone]  - Dr. Laurence Ferrari: [phone]  - Dr. Nicole Kindred: [phone]  In the event of inclement weather, please call our main line at [phone] for an update on the status of any delays or closures.  Dermatology Medication Tips: Please keep the boxes that topical medications come in in order to help keep track of the instructions about where and how to use these. Pharmacies typically print the medication  instructions only on the boxes and not directly on the medication tubes.   If your medication is too expensive, please contact our office at [phone] option 4 or send [place] a message through [place].   We are unable to tell what your co-pay for medications will be in advance as this is different depending on your insurance coverage. However, we may be able to find a substitute medication at lower cost or fill out paperwork to get insurance to cover a needed medication.   If a prior authorization is required to get your medication covered by your insurance company, please allow [place] 1-2 business days to complete this process.  Drug prices often vary depending on where the prescription is filled and some pharmacies may offer cheaper prices.  The website www.goodrx.com contains coupons for medications through different pharmacies. The prices here do not account for what the cost may be with help from insurance (it may be cheaper with your insurance), but the website can give you the price if you did not use any insurance.  - You can print the associated coupon and take it with your prescription to the pharmacy.  - You may also stop by our office during regular business hours and pick up a GoodRx coupon card.  - If you need your prescription sent electronically to a different pharmacy, notify our office through [facility] or by phone at [phone] option 4.

## 2021-07-26 NOTE — Progress Notes (Signed)
   Follow-Up Visit   Subjective  Kristy Greene is a 60 y.o. female who presents for the following: Follow-up (Patient here today for 3 month AK follow up at feet, left arm and right calf. She does not have any other areas of concern. ).  The following portions of the chart were reviewed this encounter and updated as appropriate:   Tobacco  Allergies  Meds  Problems  Med Hx  Surg Hx  Fam Hx      Review of Systems:  No other skin or systemic complaints except as noted in HPI or Assessment and Plan.  Objective  Well appearing patient in no apparent distress; mood and affect are within normal limits.  A focused examination was performed including feet, arms. Relevant physical exam findings are noted in the Assessment and Plan.  Right Lateral Foot x 2, left dorsal foot x 5, left lateral foot x 2, right dorsal foot x 2. right 3rd toe x 2 (13) Erythematous thin papules/macules with gritty scale.    Assessment & Plan  AK (actinic keratosis) (13) Right Lateral Foot x 2, left dorsal foot x 5, left lateral foot x 2, right dorsal foot x 2. right 3rd toe x 2  Prior to procedure, discussed risks of blister formation, small wound, skin dyspigmentation, or rare scar following cryotherapy. Recommend Vaseline ointment to treated areas while healing.   Destruction of lesion - Right Lateral Foot x 2, left dorsal foot x 5, left lateral foot x 2, right dorsal foot x 2. right 3rd toe x 2  Destruction method: cryotherapy   Informed consent: discussed and consent obtained   Lesion destroyed using liquid nitrogen: Yes   Cryotherapy cycles:  2 Outcome: patient tolerated procedure well with no complications   Post-procedure details: wound care instructions given    Seborrheic Keratoses - Stuck-on, waxy, tan-brown papules and/or plaques  - Benign-appearing - Discussed benign etiology and prognosis. - Observe - Call for any changes - Discussed treatment with chemical peel, not covered by  insurance  Actinic Damage - chronic, secondary to cumulative UV radiation exposure/sun exposure over time - diffuse scaly erythematous macules with underlying dyspigmentation - Recommend daily broad spectrum sunscreen SPF 30+ to sun-exposed areas, reapply every 2 hours as needed.  - Recommend staying in the shade or wearing long sleeves, sun glasses (UVA+UVB protection) and wide brim hats (4-inch brim around the entire circumference of the hat). - Call for new or changing lesions.  Return for TBSE, as scheduled.  Graciella Belton, RMA, am acting as scribe for Forest Gleason, MD .  Documentation: I have reviewed the above documentation for accuracy and completeness, and I agree with the above.  Forest Gleason, MD

## 2021-08-05 ENCOUNTER — Encounter: Payer: Self-pay | Admitting: Dermatology

## 2021-11-05 ENCOUNTER — Other Ambulatory Visit: Payer: Self-pay | Admitting: Internal Medicine

## 2021-11-05 DIAGNOSIS — Z1231 Encounter for screening mammogram for malignant neoplasm of breast: Secondary | ICD-10-CM

## 2021-11-27 ENCOUNTER — Ambulatory Visit
Admission: RE | Admit: 2021-11-27 | Discharge: 2021-11-27 | Disposition: A | Discharge status: Home / Self Care | Payer: BC Managed Care – PPO | Source: Ambulatory Visit | Attending: Internal Medicine | Admitting: Internal Medicine

## 2021-11-27 ENCOUNTER — Other Ambulatory Visit: Payer: Self-pay

## 2021-11-27 DIAGNOSIS — Z1231 Encounter for screening mammogram for malignant neoplasm of breast: Secondary | ICD-10-CM | POA: Diagnosis not present

## 2021-12-13 ENCOUNTER — Ambulatory Visit: Payer: BC Managed Care – PPO | Admitting: Dermatology

## 2022-01-15 ENCOUNTER — Ambulatory Visit: Payer: BC Managed Care – PPO | Admitting: Dermatology

## 2022-01-15 ENCOUNTER — Encounter: Payer: Self-pay | Admitting: Dermatology

## 2022-01-15 ENCOUNTER — Other Ambulatory Visit: Payer: Self-pay

## 2022-01-15 DIAGNOSIS — L570 Actinic keratosis: Secondary | ICD-10-CM | POA: Diagnosis not present

## 2022-01-15 DIAGNOSIS — L853 Xerosis cutis: Secondary | ICD-10-CM

## 2022-01-15 DIAGNOSIS — L988 Other specified disorders of the skin and subcutaneous tissue: Secondary | ICD-10-CM

## 2022-01-15 DIAGNOSIS — D229 Melanocytic nevi, unspecified: Secondary | ICD-10-CM | POA: Diagnosis not present

## 2022-01-15 DIAGNOSIS — L578 Other skin changes due to chronic exposure to nonionizing radiation: Secondary | ICD-10-CM

## 2022-01-15 DIAGNOSIS — Z86018 Personal history of other benign neoplasm: Secondary | ICD-10-CM

## 2022-01-15 DIAGNOSIS — L821 Other seborrheic keratosis: Secondary | ICD-10-CM

## 2022-01-15 DIAGNOSIS — L814 Other melanin hyperpigmentation: Secondary | ICD-10-CM

## 2022-01-15 DIAGNOSIS — D1800 Hemangioma unspecified site: Secondary | ICD-10-CM

## 2022-01-15 DIAGNOSIS — Z1283 Encounter for screening for malignant neoplasm of skin: Secondary | ICD-10-CM | POA: Diagnosis not present

## 2022-01-15 MED ORDER — VALACYCLOVIR HCL 500 MG PO TABS
500.0000 mg | ORAL_TABLET | Freq: Two times a day (BID) | ORAL | 1 refills | Status: AC
Start: 2022-01-15 — End: 2022-01-22

## 2022-01-15 NOTE — Progress Notes (Signed)
Follow-Up Visit   Subjective  Kristy Greene is a 61 y.o. female who presents for the following: Annual Exam (Hx of AK's. Hx of DN. No personal Hx of skin cancer. No particular areas of concern per patient. ).  The following portions of the chart were reviewed this encounter and updated as appropriate:  Tobacco   Allergies   Meds   Problems   Med Hx   Surg Hx   Fam Hx       Review of Systems: No other skin or systemic complaints except as noted in HPI or Assessment and Plan.   Objective  Well appearing patient in no apparent distress; mood and affect are within normal limits.  A full examination was performed including scalp, head, eyes, ears, nose, lips, neck, chest, axillae, abdomen, back, buttocks, bilateral upper extremities, bilateral lower extremities, hands, feet, fingers, toes, fingernails, and toenails. All findings within normal limits unless otherwise noted below.  Left Ankle - Posterior x1 Erythematous thin papules/macules with gritty scale.   Head - Anterior (Face) Rhytides and volume loss, scattered hyperpigmented macules   Assessment & Plan   Lentigines - Scattered tan macules - Due to sun exposure - Benign-appearing, observe - Recommend daily broad spectrum sunscreen SPF 30+ to sun-exposed areas, reapply every 2 hours as needed. - Call for any changes  Seborrheic Keratoses - Stuck-on, waxy, tan-brown papules and/or plaques  - Benign-appearing - Discussed benign etiology and prognosis. - Observe - Call for any changes  Melanocytic Nevi - Tan-brown and/or pink-flesh-colored symmetric macules and papules - Benign appearing on exam today - Observation - Call clinic for new or changing moles - Recommend daily use of broad spectrum spf 30+ sunscreen to sun-exposed areas.   Hemangiomas - Red papules - Discussed benign nature - Observe - Call for any changes  Actinic Damage - Chronic condition, secondary to cumulative UV/sun exposure - diffuse  scaly erythematous macules with underlying dyspigmentation - Recommend daily broad spectrum sunscreen SPF 30+ to sun-exposed areas, reapply every 2 hours as needed.  - Staying in the shade or wearing long sleeves, sun glasses (UVA+UVB protection) and wide brim hats (4-inch brim around the entire circumference of the hat) are also recommended for sun protection.  - Call for new or changing lesions.  Skin cancer screening performed today.  History of Dysplastic Nevus, moderate atypia - No evidence of recurrence today at right lower back - Recommend regular full body skin exams - Recommend daily broad spectrum sunscreen SPF 30+ to sun-exposed areas, reapply every 2 hours as needed.  - Call if any new or changing lesions are noted between office visits  AK (actinic keratosis) Left Ankle - Posterior x1  Actinic keratoses are precancerous spots that appear secondary to cumulative UV radiation exposure/sun exposure over time. They are chronic with expected duration over 1 year. A portion of actinic keratoses will progress to squamous cell carcinoma of the skin. It is not possible to reliably predict which spots will progress to skin cancer and so treatment is recommended to prevent development of skin cancer.  Recommend daily broad spectrum sunscreen SPF 30+ to sun-exposed areas, reapply every 2 hours as needed.  Recommend staying in the shade or wearing long sleeves, sun glasses (UVA+UVB protection) and wide brim hats (4-inch brim around the entire circumference of the hat). Call for new or changing lesions.  Prior to procedure, discussed risks of blister formation, small wound, skin dyspigmentation, or rare scar following cryotherapy. Recommend Vaseline ointment to treated areas  while healing.'  RTC 6-8 weeks if not resolved.   Destruction of lesion - Left Ankle - Posterior x1  Destruction method: cryotherapy   Informed consent: discussed and consent obtained   Lesion destroyed using liquid  nitrogen: Yes   Outcome: patient tolerated procedure well with no complications   Post-procedure details: wound care instructions given    Elastosis of skin Head - Anterior (Face)  Location: face  Valacyclovir 500mg  1 po bid x1 week starting 24 hours prior to chemical peel.   Plan Perfect Peel. Explained expected reaction, redness, peeling.      valACYclovir (VALTREX) 500 MG tablet - Head - Anterior (Face) Take 1 tablet (500 mg total) by mouth 2 (two) times daily for 7 days. Start 24 hours prior to procedure.   Xerosis - diffuse xerotic patches - recommend gentle, hydrating skin care - gentle skin care handout given   Return in about 1 year (around 01/15/2023) for TBSE.  I, Emelia Salisbury, CMA, am acting as scribe for Forest Gleason, MD.  Documentation: I have reviewed the above documentation for accuracy and completeness, and I agree with the above.  Forest Gleason, MD

## 2022-01-15 NOTE — Patient Instructions (Addendum)
Melanoma ABCDEs  Melanoma is the most dangerous type of skin cancer, and is the leading cause of death from skin disease.  You are more likely to develop melanoma if you: Have light-colored skin, light-colored eyes, or red or blond hair Spend a lot of time in the sun Tan regularly, either outdoors or in a tanning bed Have had blistering sunburns, especially during childhood Have a close family member who has had a melanoma Have atypical moles or large birthmarks  Early detection of melanoma is key since treatment is typically straightforward and cure rates are extremely high if we catch it early.   The first sign of melanoma is often a change in a mole or a new dark spot.  The ABCDE system is a way of remembering the signs of melanoma.  A for asymmetry:  The two halves do not match. B for border:  The edges of the growth are irregular. C for color:  A mixture of colors are present instead of an even brown color. D for diameter:  Melanomas are usually (but not always) greater than 77mm - the size of a pencil eraser. E for evolution:  The spot keeps changing in size, shape, and color.  Please check your skin once per month between visits. You can use a small mirror in front and a large mirror behind you to keep an eye on the back side or your body.   If you see any new or changing lesions before your next follow-up, please call to schedule a visit.  Please continue daily skin protection including broad spectrum sunscreen SPF 30+ to sun-exposed areas, reapplying every 2 hours as needed when you're outdoors.   Staying in the shade or wearing long sleeves, sun glasses (UVA+UVB protection) and wide brim hats (4-inch brim around the entire circumference of the hat) are also recommended for sun protection.    Cryotherapy Aftercare  Wash gently with soap and water everyday.   Apply Vaseline and Band-Aid daily until healed.   Prior to procedure, discussed risks of blister formation, small  wound, skin dyspigmentation, or rare scar following cryotherapy. Recommend Vaseline ointment to treated areas while healing.   Recommend taking Heliocare sun protection supplement daily in sunny weather for additional sun protection. For maximum protection on the sunniest days, you can take up to 2 capsules of regular Heliocare OR take 1 capsule of Heliocare Ultra. For prolonged exposure (such as a full day in the sun), you can repeat your dose of the supplement 4 hours after your first dose. Heliocare can be purchased at [facility] or at [email or web address].    If You Need Anything After Your Visit  If you have any questions or concerns for your doctor, please call our main line at [phone] and press option 4 to reach your doctor's medical assistant. If no one answers, please leave a voicemail as directed and we will return your call as soon as possible. Messages left after 4 pm will be answered the following business day.   You may also send [place] a message via [place]. We typically respond to MyChart messages within 1-2 business days.  For prescription refills, please ask your pharmacy to contact our office. Our fax number is [phone].  If you have an urgent issue when the clinic is closed that cannot wait until the next business day, you can page your doctor at the number below.    Please note that while we do our best to be available for urgent issues  outside of office hours, we are not available 24/7.   If you have an urgent issue and are unable to reach [place], you may choose to seek medical care at your doctor's office, retail clinic, urgent care center, or emergency room.  If you have a medical emergency, please immediately call 911 or go to the emergency department.  Pager Numbers  - Dr. Nehemiah Massed: [phone]  - Dr. Laurence Ferrari: [phone]  - Dr. Nicole Kindred: [phone]  In the event of inclement weather, please call our main line at [phone] for an update on the status  of any delays or closures.  Dermatology Medication Tips: Please keep the boxes that topical medications come in in order to help keep track of the instructions about where and how to use these. Pharmacies typically print the medication instructions only on the boxes and not directly on the medication tubes.   If your medication is too expensive, please contact our office at [phone] option 4 or send [place] a message through [place].   We are unable to tell what your co-pay for medications will be in advance as this is different depending on your insurance coverage. However, we may be able to find a substitute medication at lower cost or fill out paperwork to get insurance to cover a needed medication.   If a prior authorization is required to get your medication covered by your insurance company, please allow [place] 1-2 business days to complete this process.  Drug prices often vary depending on where the prescription is filled and some pharmacies may offer cheaper prices.  The website www.goodrx.com contains coupons for medications through different pharmacies. The prices here do not account for what the cost may be with help from insurance (it may be cheaper with your insurance), but the website can give you the price if you did not use any insurance.  - You can print the associated coupon and take it with your prescription to the pharmacy.  - You may also stop by our office during regular business hours and pick up a GoodRx coupon card.  - If you need your prescription sent electronically to a different pharmacy, notify our office through [facility] or by phone at [phone] option 4.     Si Usted Necesita Algo Despus de Su Visita  Tambin puede enviarnos un mensaje a travs de Pharmacist, community. Por lo general respondemos a los mensajes de MyChart en el transcurso de 1 a 2 das hbiles.  Para renovar recetas, por favor pida a su farmacia que se ponga en contacto con nuestra oficina.  Harland Dingwall de fax es [place] [phone].  Si tiene un asunto urgente cuando la clnica est cerrada y que no puede esperar hasta el siguiente da hbil, puede llamar/localizar a su doctor(a) al nmero que aparece a continuacin.   Por favor, tenga en cuenta que aunque hacemos todo lo posible para estar disponibles para asuntos urgentes fuera del horario de [place], no estamos disponibles las 24 horas del da, los 7 das de la [place].   Si tiene un problema urgente y no puede comunicarse con nosotros, puede optar por buscar atencin mdica  en el consultorio de su doctor(a), en una clnica privada, en un centro de atencin urgente o en una sala de emergencias.  Si tiene Engineering geologist, por favor llame inmediatamente al 911 o vaya a la sala de emergencias.  Nmeros de bper  - Dr. Nehemiah Massed: [phone]  - Dra. Moye: [phone]  - Dra. Nicole Kindred: [phone]  Cedric Fishman  de inclemencias del tiempo, por favor llame a nuestra lnea principal al [phone] para una actualizacin sobre el [place] de cualquier retraso o cierre.  Consejos para la medicacin en dermatologa: Por favor, guarde las cajas en las que vienen los medicamentos de uso tpico para ayudarle a seguir las instrucciones sobre dnde y cmo usarlos. Las farmacias generalmente imprimen las instrucciones del medicamento slo en las cajas y no directamente en los tubos del [place].   Si su medicamento es muy caro, por favor, pngase en contacto con Zigmund Daniel llamando al [phone] y presione la opcin 4 o envenos un mensaje a travs de Pharmacist, community.   No podemos decirle cul ser su copago por los medicamentos por adelantado ya que esto es diferente dependiendo de la cobertura de su seguro. Sin embargo, es posible que podamos encontrar un medicamento sustituto a Electrical engineer un formulario para que el seguro cubra el medicamento que se considera necesario.   Si se requiere una autorizacin previa para que su  compaa de seguros [place] su medicamento, por favor permtanos de 1 a 2 das hbiles para completar este proceso.  Los precios de los medicamentos varan con frecuencia dependiendo del Environmental consultant de dnde se surte la receta y alguna farmacias pueden ofrecer precios ms baratos.  El sitio web www.goodrx.com tiene cupones para medicamentos de Airline pilot. Los precios aqu no tienen en cuenta lo que podra costar con la ayuda del seguro (puede ser ms barato con su seguro), pero el sitio web puede darle el precio si no utiliz Research scientist (physical sciences).  - Puede imprimir el cupn correspondiente y llevarlo con su receta a la farmacia.  - Tambin puede pasar por nuestra oficina durante el horario de atencin regular y Charity fundraiser una tarjeta de cupones de GoodRx.  - Si necesita que su receta se enve electrnicamente a una farmacia diferente, informe a nuestra oficina a travs de MyChart de [place] o por telfono llamando al [phone] y presione la opcin 4.   Gentle Skin Care Guide  1. Bathe no more than once a day.  2. Avoid bathing in hot water  3. Use a mild soap like Dove, Vanicream, Cetaphil, CeraVe. Can use Lever 2000 or Cetaphil antibacterial soap  4. Use soap only where you need it. On most days, use it under your arms, between your legs, and on your feet. Let the water rinse other areas unless visibly dirty.  5. When you get out of the bath/shower, use a towel to gently blot your skin dry, don't rub it.  6. While your skin is still a little damp, apply a moisturizing cream such as Vanicream, CeraVe, Cetaphil, Eucerin, Sarna lotion or plain Vaseline Jelly. For hands apply Neutrogena [place] Hand Cream or Excipial Hand Cream.  7. Reapply moisturizer any time you start to itch or feel dry.  8. Sometimes using free and clear laundry detergents can be helpful. Fabric softener sheets should be avoided. Downy Free & Gentle liquid, or any liquid fabric softener that is free of dyes and perfumes,  it acceptable to use  9. If your doctor has given you prescription creams you may apply moisturizers over them

## 2022-01-25 ENCOUNTER — Encounter: Payer: Self-pay | Admitting: Dermatology

## 2022-02-20 ENCOUNTER — Other Ambulatory Visit: Payer: Self-pay

## 2022-02-20 ENCOUNTER — Ambulatory Visit (INDEPENDENT_AMBULATORY_CARE_PROVIDER_SITE_OTHER): Payer: Self-pay | Admitting: Dermatology

## 2022-02-20 DIAGNOSIS — L988 Other specified disorders of the skin and subcutaneous tissue: Secondary | ICD-10-CM

## 2022-02-20 NOTE — Progress Notes (Signed)
° °  Follow-Up Visit   Subjective  Kristy Greene is a 61 y.o. female who presents for the following: Follow-up (Patient here today for perfect peel treatment at face. ).   The following portions of the chart were reviewed this encounter and updated as appropriate:  Tobacco   Allergies   Meds   Problems   Med Hx   Surg Hx   Fam Hx       Review of Systems: No other skin or systemic complaints except as noted in HPI or Assessment and Plan.   Objective  Well appearing patient in no apparent distress; mood and affect are within normal limits.  A focused examination was performed including face. Relevant physical exam findings are noted in the Assessment and Plan.  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 Yes, started yesterday. Continue valacyclovir 500 mg twice a day as directed Booster used? no Face prepped with acetone Perfect Derma Peel applied with gauze avoiding eyes, nares and mouth Post-Peel Care kit provided and instructions reviewed. Patient advised to leave peel on for 6 hours before washing face. Ok to leave on overnight if not irritated. Need for sun protection after peel reviewed.   Location: face  Chemical used: The Perfect Derma Peel  Informed consent: Discussed risks (infection, pain, swelling, peeling, blistering, allergic reaction, redness, textural changes of skin, crusting, ulceration, discoloration of skin, undesirable cosmetic result, need for additional treatment, herpes outbreak, and scarring) and benefits of the procedure, as well as the alternatives. Informed consent was obtained.  HSV Prophylaxis: Patient confirms they started valacyclovir prior to the procedure and will continue as directed.  Preparation: The area was cleansed prior to the application of the chemical.  Procedure Details: Prior to applying the peel, the area was cleansed with acetone to  remove oils. The chemical was applied using a standard technique. The patient was fanned for comfort.  Plan: The patient was advised to leave the peel on for 6 hours and then wash off with a gentle cleanser and follow with the post-peel moisturizer containing 1% hydrocortisone. She was instructed how to use the post-peel towelettes and sunscreen as described in the post-peel instruction pamphlet. Patient advised not to pull or pick at peeling skin. Patient will call for any problems including crusting, ulceration, fever, pus, or significant discomfort.  Return to clinic for additional treatment as needed.     Return if symptoms worsen or fail to improve, for keep appointment as scheduled in january .  I, Ruthell Rummage, CMA, am acting as scribe for Forest Gleason, MD.   Documentation: I have reviewed the above documentation for accuracy and completeness, and I agree with the above.  Forest Gleason, MD

## 2022-02-20 NOTE — Patient Instructions (Addendum)
The Perfect Derma Peel  Risks (infection, pain, swelling, peeling, blistering, allergic reaction, redness, textural changes of skin, crusting, ulceration, discoloration of skin, undesirable cosmetic result, need for additional treatment, herpes outbreak, and scarring) and benefits of the procedure, as well as the alternatives. Informed consent was obtained.  Continue Valacyclovir as directed.  Leave the peel on for 6 hours and then wash off with a gentle cleanser and follow with the post-peel moisturizer containing 1% hydrocortisone. Use the post-peel towelettes and sunscreen as described in the post-peel instruction pamphlet. Do not to pull or pick at peeling skin. Call for any problems including crusting, ulceration, fever, pus, or significant discomfort.  Return to clinic for additional treatment as needed.   If You Need Anything After Your Visit  If you have any questions or concerns for your doctor, please call our main line at [phone] and press option 4 to reach your doctor's medical assistant. If no one answers, please leave a voicemail as directed and we will return your call as soon as possible. Messages left after 4 pm will be answered the following business day.   You may also send [place] a message via [place]. We typically respond to MyChart messages within 1-2 business days.  For prescription refills, please ask your pharmacy to contact our office. Our fax number is [phone].  If you have an urgent issue when the clinic is closed that cannot wait until the next business day, you can page your doctor at the number below.    Please note that while we do our best to be available for urgent issues outside of office hours, we are not available 24/7.   If you have an urgent issue and are unable to reach [place], you may choose to seek medical care at your doctor's office, retail clinic, urgent care center, or emergency room.  If you have a medical emergency, please immediately call 911  or go to the emergency department.  Pager Numbers  - Dr. Nehemiah Massed: [phone]  - Dr. Laurence Ferrari: [phone]  - Dr. Nicole Kindred: [phone]  In the event of inclement weather, please call our main line at [phone] for an update on the status of any delays or closures.  Dermatology Medication Tips: Please keep the boxes that topical medications come in in order to help keep track of the instructions about where and how to use these. Pharmacies typically print the medication instructions only on the boxes and not directly on the medication tubes.   If your medication is too expensive, please contact our office at [phone] option 4 or send [place] a message through [place].   We are unable to tell what your co-pay for medications will be in advance as this is different depending on your insurance coverage. However, we may be able to find a substitute medication at lower cost or fill out paperwork to get insurance to cover a needed medication.   If a prior authorization is required to get your medication covered by your insurance company, please allow [place] 1-2 business days to complete this process.  Drug prices often vary depending on where the prescription is filled and some pharmacies may offer cheaper prices.  The website www.goodrx.com contains coupons for medications through different pharmacies. The prices here do not account for what the cost may be with help from insurance (it may be cheaper with your insurance), but the website can give you the price if you did not use any insurance.  - You can print the associated coupon and  take it with your prescription to the pharmacy.  - You may also stop by our office during regular business hours and pick up a GoodRx coupon card.  - If you need your prescription sent electronically to a different pharmacy, notify our office through [facility] or by phone at [phone] option 4.     Si Usted Necesita Algo Despus de Su  Visita  Tambin puede enviarnos un mensaje a travs de Pharmacist, community. Por lo general respondemos a los mensajes de MyChart en el transcurso de 1 a 2 das hbiles.  Para renovar recetas, por favor pida a su farmacia que se ponga en contacto con nuestra oficina. Harland Dingwall de fax es [place] [phone].  Si tiene un asunto urgente cuando la clnica est cerrada y que no puede esperar hasta el siguiente da hbil, puede llamar/localizar a su doctor(a) al nmero que aparece a continuacin.   Por favor, tenga en cuenta que aunque hacemos todo lo posible para estar disponibles para asuntos urgentes fuera del horario de [place], no estamos disponibles las 24 horas del da, los 7 das de la [place].   Si tiene un problema urgente y no puede comunicarse con nosotros, puede optar por buscar atencin mdica  en el consultorio de su doctor(a), en una clnica privada, en un centro de atencin urgente o en una sala de emergencias.  Si tiene Engineering geologist, por favor llame inmediatamente al 911 o vaya a la sala de emergencias.  Nmeros de bper  - Dr. Nehemiah Massed: [phone]  - Dra. Moye: [phone]  - Dra. Nicole Kindred: [phone]  En caso de inclemencias del [place], por favor llame a Johnsie Kindred principal al [phone] para una actualizacin sobre el [place] de cualquier retraso o cierre.  Consejos para la medicacin en dermatologa: Por favor, guarde las cajas en las que vienen los medicamentos de uso tpico para ayudarle a seguir las instrucciones sobre dnde y cmo usarlos. Las farmacias generalmente imprimen las instrucciones del medicamento slo en las cajas y no directamente en los tubos del [place].   Si su medicamento es muy caro, por favor, pngase en contacto con Zigmund Daniel llamando al [phone] y presione la opcin 4 o envenos un mensaje a travs de Pharmacist, community.   No podemos decirle cul ser su copago por los medicamentos por adelantado ya que esto es diferente dependiendo de la  cobertura de su seguro. Sin embargo, es posible que podamos encontrar un medicamento sustituto a Electrical engineer un formulario para que el seguro cubra el medicamento que se considera necesario.   Si se requiere una autorizacin previa para que su compaa de seguros [place] su medicamento, por favor permtanos de 1 a 2 das hbiles para completar este proceso.  Los precios de los medicamentos varan con frecuencia dependiendo del Environmental consultant de dnde se surte la receta y alguna farmacias pueden ofrecer precios ms baratos.  El sitio web www.goodrx.com tiene cupones para medicamentos de Airline pilot. Los precios aqu no tienen en cuenta lo que podra costar con la ayuda del seguro (puede ser ms barato con su seguro), pero el sitio web puede darle el precio si no utiliz Research scientist (physical sciences).  - Puede imprimir el cupn correspondiente y llevarlo con su receta a la farmacia.  - Tambin puede pasar por nuestra oficina durante el horario de atencin regular y Charity fundraiser una tarjeta de cupones de GoodRx.  - Si necesita que su receta se enve electrnicamente a Chiropodist, informe a nuestra oficina a travs de MyChart  [place] o por telfono llamando al [phone] y presione la opcin 4.

## 2022-02-25 ENCOUNTER — Encounter: Payer: Self-pay | Admitting: Dermatology

## 2022-03-11 IMAGING — MG MM DIGITAL SCREENING BILAT W/ TOMO AND CAD
8 series · 8 of 24 positions shown · non-contrast
Comparison: Previous exam(s).

CLINICAL DATA: Screening.

EXAM:
DIGITAL SCREENING BILATERAL MAMMOGRAM WITH TOMOSYNTHESIS AND CAD
TECHNIQUE: Bilateral screening digital craniocaudal and mediolateral oblique
mammograms were obtained. Bilateral screening digital breast
tomosynthesis was performed. The images were evaluated with
computer-aided detection.

[L MLO synth-2D]
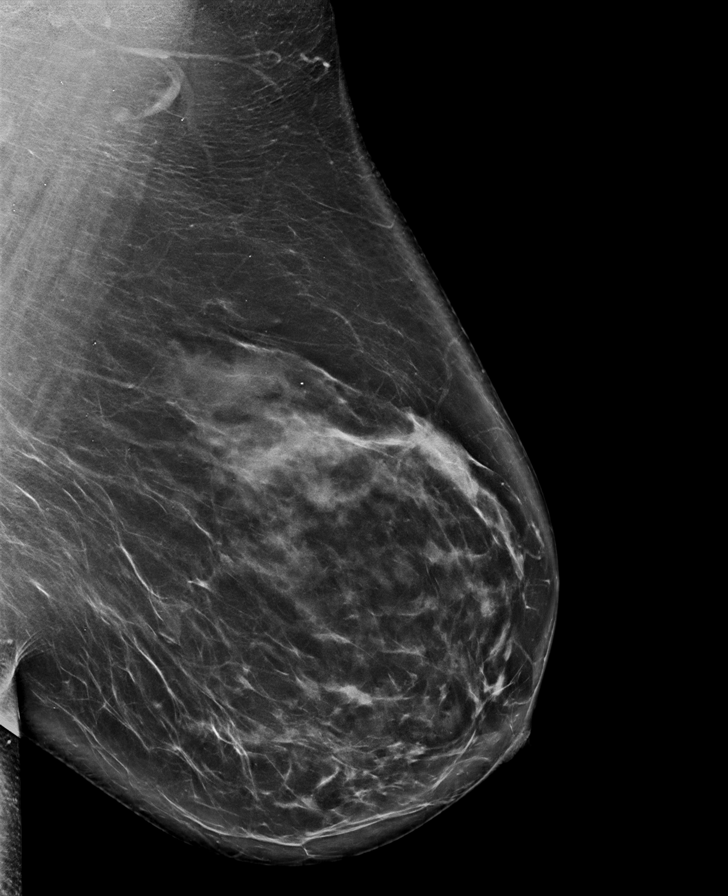

[R MLO synth-2D]
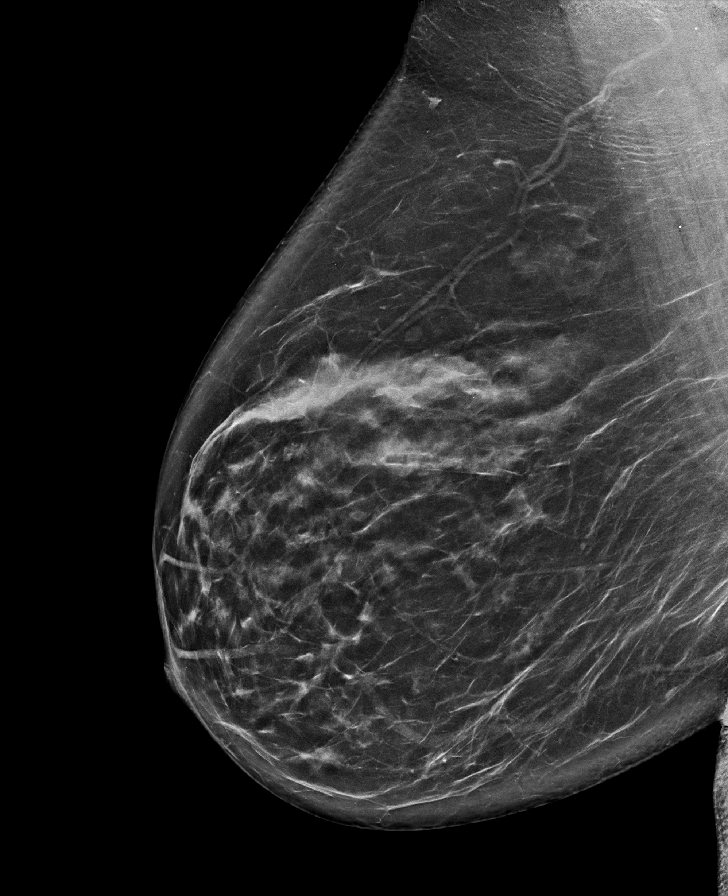

[R CC synth-2D]
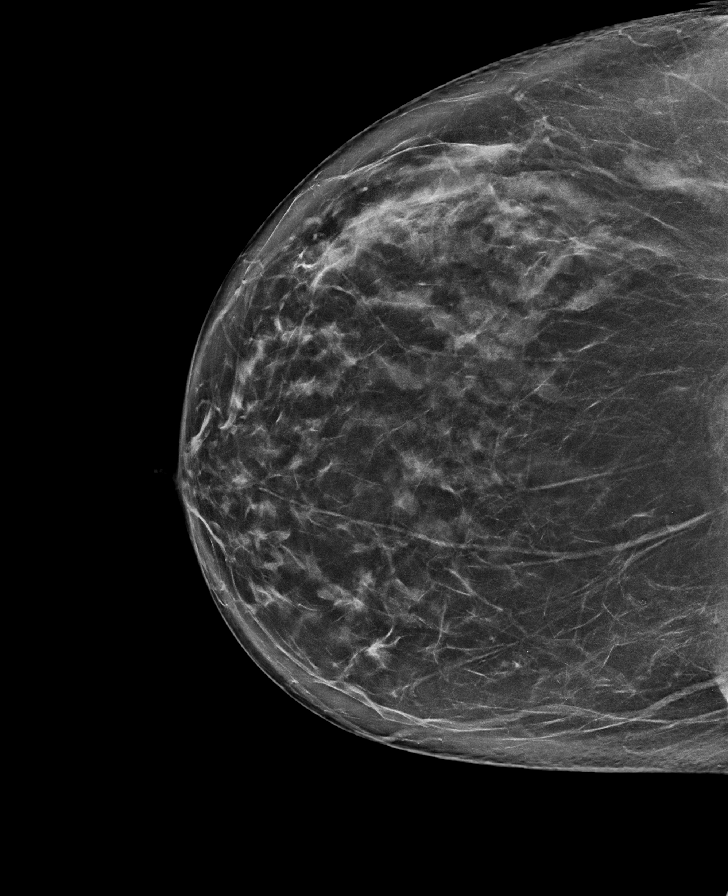

[L CC synth-2D]
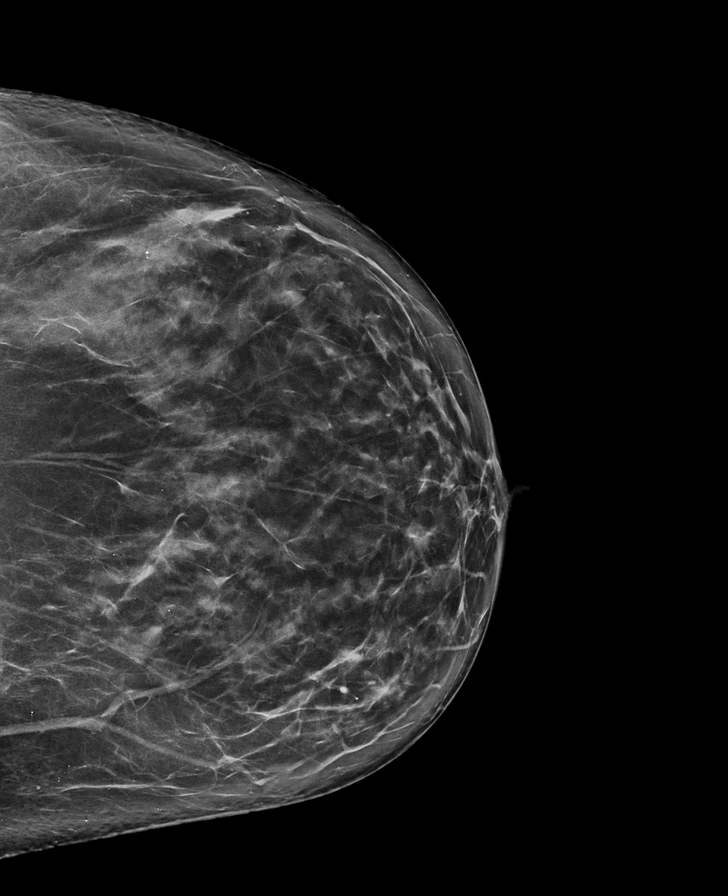

[L CC tomo · tomo slice 44/87.0]
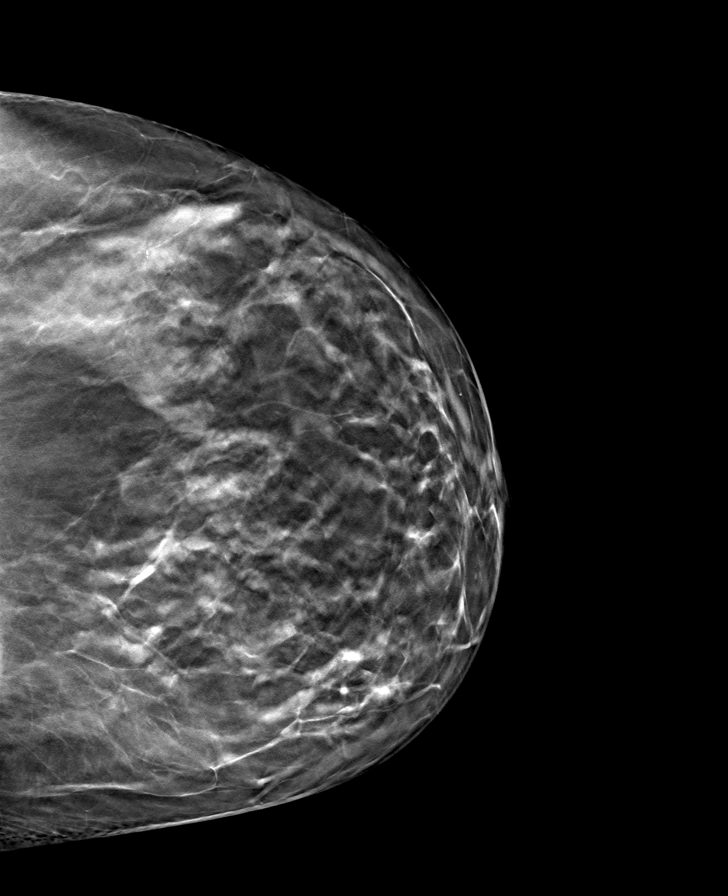

[R CC tomo · tomo slice 45/89.0]
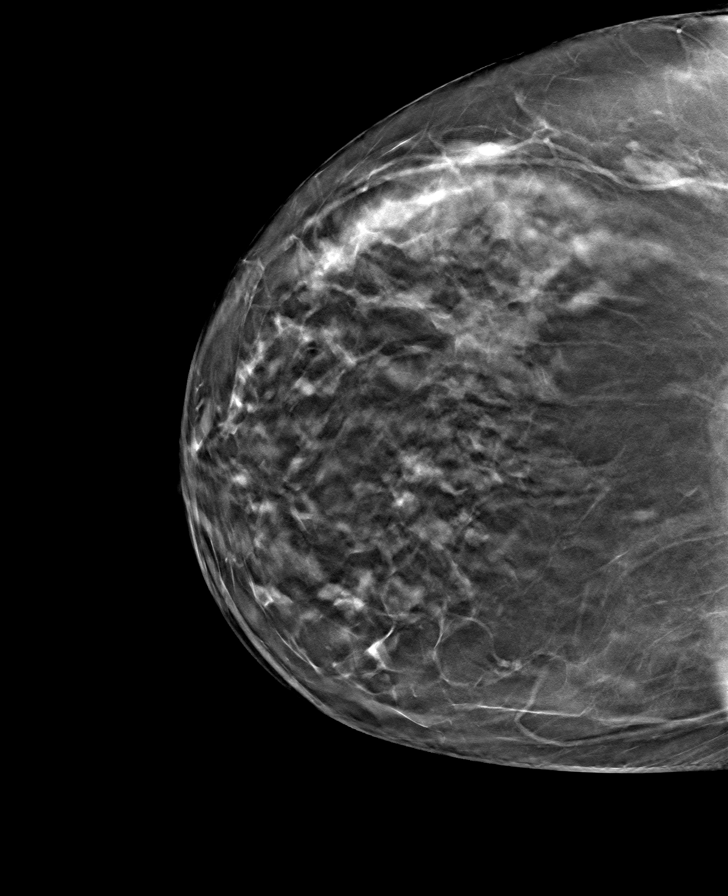

[R MLO tomo · tomo slice 46/91.0]
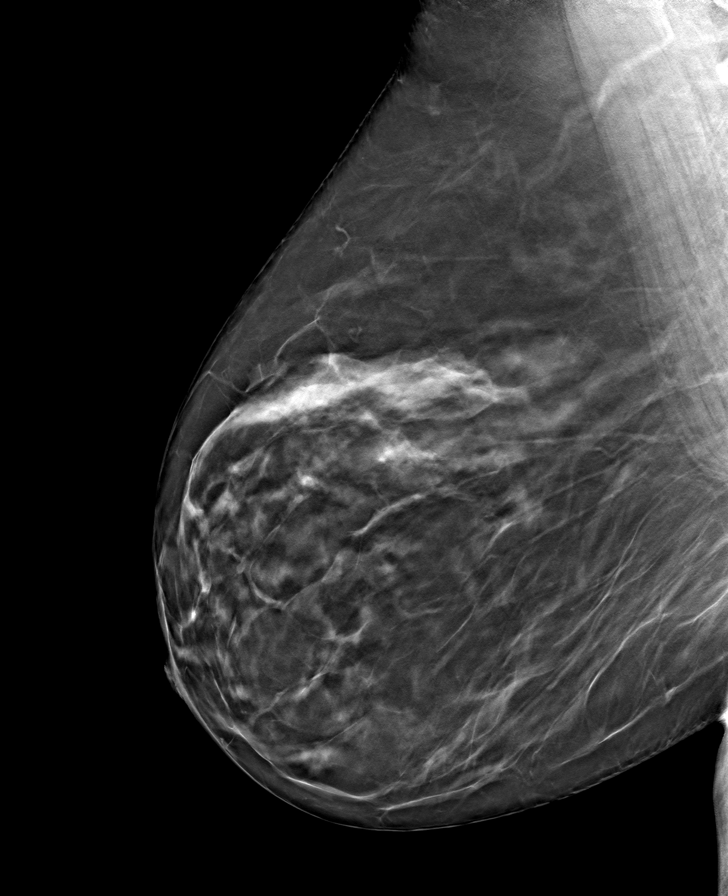

[L MLO tomo · tomo slice 53/104.0]
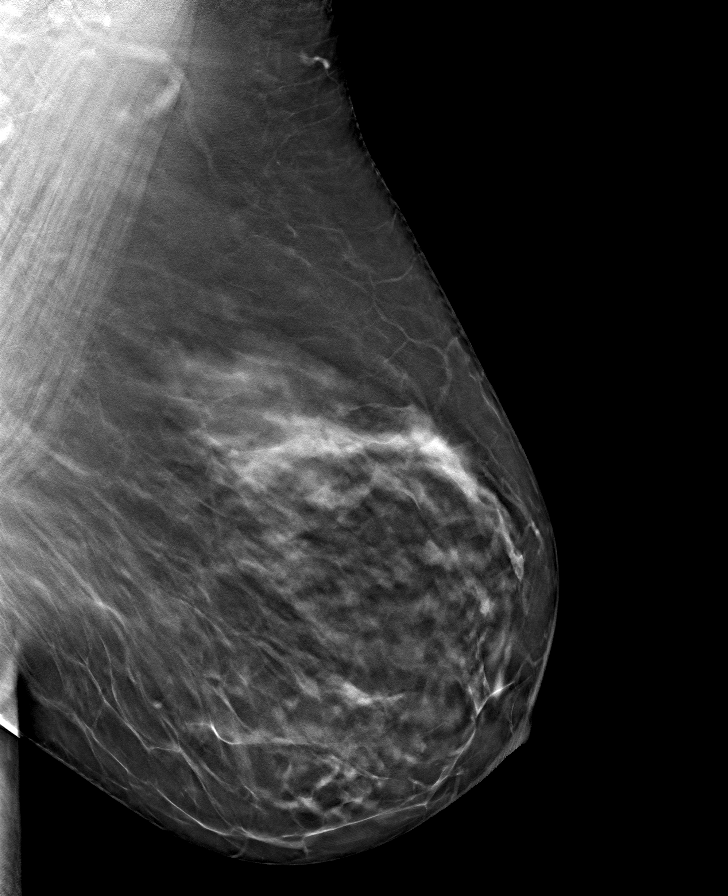

[8 of 24 positions shown; findings below may reference images not displayed]

ACR Breast Density Category c: The breast tissue is heterogeneously
dense, which may obscure small masses.
FINDINGS: There are no findings suspicious for malignancy.
IMPRESSION: No mammographic evidence of malignancy. A result letter of this
screening mammogram will be mailed directly to the patient.

RECOMMENDATION:
Screening mammogram in one year. (Code:[id number]-BC3)

BI-RADS CATEGORY  1: Negative.

## 2022-07-17 ENCOUNTER — Encounter: Payer: Self-pay | Admitting: Obstetrics and Gynecology

## 2022-07-17 ENCOUNTER — Ambulatory Visit (INDEPENDENT_AMBULATORY_CARE_PROVIDER_SITE_OTHER): Payer: BC Managed Care – PPO | Admitting: Obstetrics and Gynecology

## 2022-07-17 DIAGNOSIS — Z01419 Encounter for gynecological examination (general) (routine) without abnormal findings: Secondary | ICD-10-CM

## 2022-07-17 NOTE — Progress Notes (Signed)
Patients presents for annual exam today. She states she is doing well, reports no trouble with postmenopausal bleeding or symptoms. Patient is up to date on pap smear and mammogram.  Patients annual labs deferred. She states no other questions or concerns at this time.

## 2022-07-17 NOTE — Progress Notes (Signed)
HPI:      [name] is a 61 y.o. [phone] who LMP was No LMP recorded. Patient is postmenopausal.  Subjective:   She presents today for her annual examination.  She has no complaints.  She feels her medical health is about the same as last year. Her primary care physician orders her mammogram and she has an upcoming colonoscopy scheduled. She has a remote history of positive HPV.  Previous exams have shown cystocele and rectocele present.  She has no complaints regarding this today.    Hx: The following portions of the patient's history were reviewed and updated as appropriate:             She  has a past medical history of Acid reflux, Actinic keratosis, AR (allergic rhinitis), Cystocele, Dysplastic nevus (09/23/2019), HPV test positive, Hypercholesteremia, Increased BMI, Menopausal state, Rectocele, SUI (stress urinary incontinence, female), Uterine fibroid, Uterine prolapse, and Vaginal dryness. She does not have any pertinent problems on file. She  has a past surgical history that includes Cesarean section and Breast cyst aspiration (Left, 2005). Her family history includes Colon cancer in her maternal grandfather; Diabetes in her mother and paternal grandmother; Heart disease in her mother; Uterine cancer in her sister. She  reports that she has never smoked. She has never used smokeless tobacco. She reports current alcohol use. She reports that she does not use drugs. She has a current medication list which includes the following prescription(s): amlodipine, calcium-vitamin d, famotidine, latanoprost, multiple vitamin, and pantoprazole. She has No Known Allergies.       Review of Systems:  Review of Systems  Constitutional: Denied constitutional symptoms, night sweats, recent illness, fatigue, fever, insomnia and weight loss.  Eyes: Denied eye symptoms, eye pain, photophobia, vision change and visual disturbance.  Ears/Nose/Throat/Neck: Denied ear, nose, throat or neck  symptoms, hearing loss, nasal discharge, sinus congestion and sore throat.  Cardiovascular: Denied cardiovascular symptoms, arrhythmia, chest pain/pressure, edema, exercise intolerance, orthopnea and palpitations.  Respiratory: Denied pulmonary symptoms, asthma, pleuritic pain, productive sputum, cough, dyspnea and wheezing.  Gastrointestinal: Denied, gastro-esophageal reflux, melena, nausea and vomiting.  Genitourinary: Denied genitourinary symptoms including symptomatic vaginal discharge, pelvic relaxation issues, and urinary complaints.  Musculoskeletal: Denied musculoskeletal symptoms, stiffness, swelling, muscle weakness and myalgia.  Dermatologic: Denied dermatology symptoms, rash and scar.  Neurologic: Denied neurology symptoms, dizziness, headache, neck pain and syncope.  Psychiatric: Denied psychiatric symptoms, anxiety and depression.  Endocrine: Denied endocrine symptoms including hot flashes and night sweats.   Meds:   Current Outpatient Medications on File Prior to Visit  Medication Sig Dispense Refill   amLODipine (NORVASC) 5 MG tablet Take by mouth.     calcium-vitamin D 250-100 MG-UNIT tablet Take 1 tablet by mouth 2 (two) times daily.     famotidine (PEPCID) 40 MG tablet      latanoprost (XALATAN) 0.005 % ophthalmic solution Apply to eye.     Multiple Vitamin (MULTI VITAMIN DAILY PO) Take by mouth.     pantoprazole (PROTONIX) 40 MG tablet      No current facility-administered medications on file prior to visit.     Objective:     There were no vitals filed for this visit.  There were no vitals filed for this visit.            Physical examination General NAD, Conversant  HEENT Atraumatic; Op clear with mmm.  Normo-cephalic. Pupils reactive. Anicteric sclerae  Thyroid/Neck Smooth without nodularity or enlargement. Normal ROM.  Neck Supple.  Skin No rashes, lesions or ulceration. Normal palpated skin turgor. No nodularity.  Breasts: No masses or discharge.   Symmetric.  No axillary adenopathy.  Lungs: Clear to auscultation.No rales or wheezes. Normal Respiratory effort, no retractions.  Heart: NSR.  No murmurs or rubs appreciated. No periferal edema  Abdomen: Soft.  Non-tender.  No masses.  No HSM. No hernia  Extremities: Moves all appropriately.  Normal ROM for age. No lymphadenopathy.  Neuro: Oriented to PPT.  Normal mood. Normal affect.     Pelvic:   Vulva: Normal appearance.  No lesions.  Vagina: No lesions or abnormalities noted.  Support: Second-degree cystocele second-degree rectocele  Urethra No masses tenderness or scarring.  Meatus Normal size without lesions or prolapse.  Cervix: Normal appearance.  No lesions.  Anus: Normal exam.  No lesions.  Perineum: Normal exam.  No lesions.        Bimanual   Uterus: Normal size.  Non-tender.  Mobile.  AV.  Adnexae: No masses.  Non-tender to palpation.  Cul-de-sac: Negative for abnormality.     Assessment:    [id number] Patient Active Problem List   Diagnosis Date Noted   Obesity (BMI 30.0-34.9) 04/29/2017   Constipation 04/29/2017     1. Well woman exam with routine gynecological exam     No change in annual exam from last year.  Patient generally doing well.   Plan:            1.  Basic Screening Recommendations The basic screening recommendations for asymptomatic women were discussed with the patient during her visit.  The age-appropriate recommendations were discussed with her and the rational for the tests reviewed.  When I am informed by the patient that another primary care physician has previously obtained the age-appropriate tests and they are up-to-date, only outstanding tests are ordered and referrals given as necessary.  Abnormal results of tests will be discussed with her when all of her results are completed.  Routine preventative health maintenance measures emphasized: Exercise/Diet/Weight control, Tobacco Warnings, Alcohol/Substance use risks and Stress  Management  Orders No orders of the defined types were placed in this encounter.   No orders of the defined types were placed in this encounter.           F/U  Return in about 1 year (around 07/18/2023) for Annual Physical.  Finis Bud, M.D. 07/17/2022 2:44 PM

## 2022-10-24 ENCOUNTER — Other Ambulatory Visit: Payer: Self-pay | Admitting: Internal Medicine

## 2022-11-01 ENCOUNTER — Other Ambulatory Visit: Payer: Self-pay | Admitting: Internal Medicine

## 2022-11-01 DIAGNOSIS — Z1231 Encounter for screening mammogram for malignant neoplasm of breast: Secondary | ICD-10-CM

## 2022-12-09 ENCOUNTER — Ambulatory Visit
Admission: RE | Admit: 2022-12-09 | Discharge: 2022-12-09 | Disposition: A | Discharge status: Home / Self Care | Payer: BC Managed Care – PPO | Source: Ambulatory Visit | Attending: Internal Medicine | Admitting: Internal Medicine

## 2022-12-09 DIAGNOSIS — Z1231 Encounter for screening mammogram for malignant neoplasm of breast: Secondary | ICD-10-CM | POA: Insufficient documentation

## 2023-01-15 ENCOUNTER — Encounter: Payer: Self-pay | Admitting: Dermatology

## 2023-01-15 ENCOUNTER — Ambulatory Visit: Payer: BC Managed Care – PPO | Admitting: Dermatology

## 2023-01-15 VITALS — BP 142/81 | HR 89

## 2023-01-15 DIAGNOSIS — C44311 Basal cell carcinoma of skin of nose: Secondary | ICD-10-CM | POA: Diagnosis not present

## 2023-01-15 DIAGNOSIS — D492 Neoplasm of unspecified behavior of bone, soft tissue, and skin: Secondary | ICD-10-CM

## 2023-01-15 DIAGNOSIS — L570 Actinic keratosis: Secondary | ICD-10-CM

## 2023-01-15 DIAGNOSIS — Z1283 Encounter for screening for malignant neoplasm of skin: Secondary | ICD-10-CM | POA: Diagnosis not present

## 2023-01-15 DIAGNOSIS — L578 Other skin changes due to chronic exposure to nonionizing radiation: Secondary | ICD-10-CM

## 2023-01-15 DIAGNOSIS — Z86018 Personal history of other benign neoplasm: Secondary | ICD-10-CM

## 2023-01-15 DIAGNOSIS — C4491 Basal cell carcinoma of skin, unspecified: Secondary | ICD-10-CM

## 2023-01-15 DIAGNOSIS — L821 Other seborrheic keratosis: Secondary | ICD-10-CM

## 2023-01-15 DIAGNOSIS — D229 Melanocytic nevi, unspecified: Secondary | ICD-10-CM

## 2023-01-15 DIAGNOSIS — L814 Other melanin hyperpigmentation: Secondary | ICD-10-CM | POA: Diagnosis not present

## 2023-01-15 HISTORY — DX: Basal cell carcinoma of skin, unspecified: C44.91

## 2023-01-15 NOTE — Patient Instructions (Addendum)
Cryotherapy Aftercare  Wash gently with soap and water everyday.   Apply Vaseline Jelly daily until healed.    Recommend taking Vitamin D 600 iu daily.      Wound Care Instructions  Cleanse wound gently with soap and water once a day then pat dry with clean gauze. Apply a thin coat of Petrolatum (petroleum jelly, "Vaseline") over the wound (unless you have an allergy to this). We recommend that you use a new, sterile tube of Vaseline. Do not pick or remove scabs. Do not remove the yellow or white "healing tissue" from the base of the wound.  Cover the wound with fresh, clean, nonstick gauze and secure with paper tape. You may use Band-Aids in place of gauze and tape if the wound is small enough, but would recommend trimming much of the tape off as there is often too much. Sometimes Band-Aids can irritate the skin.  You should call the office for your biopsy report after 1 week if you have not already been contacted.  If you experience any problems, such as abnormal amounts of bleeding, swelling, significant bruising, significant pain, or evidence of infection, please call the office immediately.  FOR ADULT SURGERY PATIENTS: If you need something for pain relief you may take 1 extra strength Tylenol (acetaminophen) AND 2 Ibuprofen ('200mg'$  each) together every 4 hours as needed for pain. (do not take these if you are allergic to them or if you have a reason you should not take them.) Typically, you may only need pain medication for 1 to 3 days.     Recommend daily broad spectrum sunscreen SPF 30+ to sun-exposed areas, reapply every 2 hours as needed. Call for new or changing lesions.  Staying in the shade or wearing long sleeves, sun glasses (UVA+UVB protection) and wide brim hats (4-inch brim around the entire circumference of the hat) are also recommended for sun protection.    Recommend taking Heliocare sun protection supplement daily in sunny weather for additional sun protection. For  maximum protection on the sunniest days, you can take up to 2 capsules of regular Heliocare OR take 1 capsule of Heliocare Ultra. For prolonged exposure (such as a full day in the sun), you can repeat your dose of the supplement 4 hours after your first dose. Heliocare can be purchased at [employer], at some [facility] or at [email or web address].    Melanoma ABCDEs  Melanoma is the most dangerous type of skin cancer, and is the leading cause of death from skin disease.  You are more likely to develop melanoma if you: Have light-colored skin, light-colored eyes, or red or blond hair Spend a lot of time in the sun Tan regularly, either outdoors or in a tanning bed Have had blistering sunburns, especially during childhood Have a close family member who has had a melanoma Have atypical moles or large birthmarks  Early detection of melanoma is key since treatment is typically straightforward and cure rates are extremely high if we catch it early.   The first sign of melanoma is often a change in a mole or a new dark spot.  The ABCDE system is a way of remembering the signs of melanoma.  A for asymmetry:  The two halves do not match. B for border:  The edges of the growth are irregular. C for color:  A mixture of colors are present instead of an even brown color. D for diameter:  Melanomas are usually (but not always) greater than 73m - the size  of a pencil eraser. E for evolution:  The spot keeps changing in size, shape, and color.  Please check your skin once per month between visits. You can use a small mirror in front and a large mirror behind you to keep an eye on the back side or your body.   If you see any new or changing lesions before your next follow-up, please call to schedule a visit.  Please continue daily skin protection including broad spectrum sunscreen SPF 30+ to sun-exposed areas, reapplying every 2 hours as needed when you're outdoors.   Staying in the shade or wearing  long sleeves, sun glasses (UVA+UVB protection) and wide brim hats (4-inch brim around the entire circumference of the hat) are also recommended for sun protection.    Due to recent changes in healthcare laws, you may see results of your pathology and/or laboratory studies on MyChart before the doctors have had a chance to review them. We understand that in some cases there may be results that are confusing or concerning to you. Please understand that not all results are received at the same time and often the doctors may need to interpret multiple results in order to provide you with the best plan of care or course of treatment. Therefore, we ask that you please give [place] 2 business days to thoroughly review all your results before contacting the office for clarification. Should we see a critical lab result, you will be contacted sooner.   If You Need Anything After Your Visit  If you have any questions or concerns for your doctor, please call our main line at [phone] and press option 4 to reach your doctor's medical assistant. If no one answers, please leave a voicemail as directed and we will return your call as soon as possible. Messages left after 4 pm will be answered the following business day.   You may also send [place] a message via [place]. We typically respond to MyChart messages within 1-2 business days.  For prescription refills, please ask your pharmacy to contact our office. Our fax number is [phone].  If you have an urgent issue when the clinic is closed that cannot wait until the next business day, you can page your doctor at the number below.    Please note that while we do our best to be available for urgent issues outside of office hours, we are not available 24/7.   If you have an urgent issue and are unable to reach [place], you may choose to seek medical care at your doctor's office, retail clinic, urgent care center, or emergency room.  If you have a medical emergency, please  immediately call 911 or go to the emergency department.  Pager Numbers  - Dr. Nehemiah Massed: [phone]  - Dr. Laurence Ferrari: [phone]  - Dr. Nicole Kindred: [phone]  In the event of inclement weather, please call our main line at [phone] for an update on the status of any delays or closures.  Dermatology Medication Tips: Please keep the boxes that topical medications come in in order to help keep track of the instructions about where and how to use these. Pharmacies typically print the medication instructions only on the boxes and not directly on the medication tubes.   If your medication is too expensive, please contact our office at [phone] option 4 or send [place] a message through [place].   We are unable to tell what your co-pay for medications will be in advance as this is different depending on your insurance  coverage. However, we may be able to find a substitute medication at lower cost or fill out paperwork to get insurance to cover a needed medication.   If a prior authorization is required to get your medication covered by your insurance company, please allow [place] 1-2 business days to complete this process.  Drug prices often vary depending on where the prescription is filled and some pharmacies may offer cheaper prices.  The website www.goodrx.com contains coupons for medications through different pharmacies. The prices here do not account for what the cost may be with help from insurance (it may be cheaper with your insurance), but the website can give you the price if you did not use any insurance.  - You can print the associated coupon and take it with your prescription to the pharmacy.  - You may also stop by our office during regular business hours and pick up a GoodRx coupon card.  - If you need your prescription sent electronically to a different pharmacy, notify our office through [facility] or by phone at [phone] option 4.     Si Usted Necesita Algo Despus  de Su Visita  Tambin puede enviarnos un mensaje a travs de Pharmacist, community. Por lo general respondemos a los mensajes de MyChart en el transcurso de 1 a 2 das hbiles.  Para renovar recetas, por favor pida a su farmacia que se ponga en contacto con nuestra oficina. Harland Dingwall de fax es [place] [phone].  Si tiene un asunto urgente cuando la clnica est cerrada y que no puede esperar hasta el siguiente da hbil, puede llamar/localizar a su doctor(a) al nmero que aparece a continuacin.   Por favor, tenga en cuenta que aunque hacemos todo lo posible para estar disponibles para asuntos urgentes fuera del horario de [place], no estamos disponibles las 24 horas del da, los 7 das de la [place].   Si tiene un problema urgente y no puede comunicarse con nosotros, puede optar por buscar atencin mdica  en el consultorio de su doctor(a), en una clnica privada, en un centro de atencin urgente o en una sala de emergencias.  Si tiene Engineering geologist, por favor llame inmediatamente al 911 o vaya a la sala de emergencias.  Nmeros de bper  - Dr. Nehemiah Massed: [phone]  - Dra. Moye: [phone]  - Dra. Nicole Kindred: [phone]  En caso de inclemencias del [place], por favor llame a Johnsie Kindred principal al [phone] para una actualizacin sobre el [place] de cualquier retraso o cierre.  Consejos para la medicacin en dermatologa: Por favor, guarde las cajas en las que vienen los medicamentos de uso tpico para ayudarle a seguir las instrucciones sobre dnde y cmo usarlos. Las farmacias generalmente imprimen las instrucciones del medicamento slo en las cajas y no directamente en los tubos del [place].   Si su medicamento es muy caro, por favor, pngase en contacto con Zigmund Daniel llamando al [phone] y presione la opcin 4 o envenos un mensaje a travs de Pharmacist, community.   No podemos decirle cul ser su copago por los medicamentos por adelantado ya que esto es diferente dependiendo  de la cobertura de su seguro. Sin embargo, es posible que podamos encontrar un medicamento sustituto a Electrical engineer un formulario para que el seguro cubra el medicamento que se considera necesario.   Si se requiere una autorizacin previa para que su compaa de seguros [place] su medicamento, por favor permtanos de 1 a 2 das hbiles para completar este proceso.  [place]  de los medicamentos varan con frecuencia dependiendo del lugar de dnde se surte la receta y alguna farmacias pueden ofrecer precios ms baratos.  El sitio web www.goodrx.com tiene cupones para medicamentos de Airline pilot. Los precios aqu no tienen en cuenta lo que podra costar con la ayuda del seguro (puede ser ms barato con su seguro), pero el sitio web puede darle el precio si no utiliz Research scientist (physical sciences).  - Puede imprimir el cupn correspondiente y llevarlo con su receta a la farmacia.  - Tambin puede pasar por nuestra oficina durante el horario de atencin regular y Charity fundraiser una tarjeta de cupones de GoodRx.  - Si necesita que su receta se enve electrnicamente a una farmacia diferente, informe a nuestra oficina a travs de MyChart de [place] o por telfono llamando al [phone] y presione la opcin 4.

## 2023-01-15 NOTE — Progress Notes (Signed)
Follow-Up Visit   Subjective  Kristy Greene is a 62 y.o. female who presents for the following: Annual Exam (Hx of dysplastic nevus. No personal Hx of skin cancer).  The patient presents for Total-Body Skin Exam (TBSE) for skin cancer screening and mole check.  The patient has spots, moles and lesions to be evaluated, some may be new or changing and the patient has concerns that these could be cancer.   The following portions of the chart were reviewed this encounter and updated as appropriate:  Tobacco  Allergies  Meds  Problems  Med Hx  Surg Hx  Fam Hx      Review of Systems: No other skin or systemic complaints except as noted in HPI or Assessment and Plan.   Objective  Well appearing patient in no apparent distress; mood and affect are within normal limits.  A full examination was performed including scalp, head, eyes, ears, nose, lips, neck, chest, axillae, abdomen, back, buttocks, bilateral upper extremities, bilateral lower extremities, hands, feet, fingers, toes, fingernails, and toenails. All findings within normal limits unless otherwise noted below.  Right Upper Arm x3, left lateral thigh x2 (5) Erythematous thin papules/macules with gritty scale.   Right Nasal Sidewall 0.4 cm pink and tan thin papule      Assessment & Plan   History of Dysplastic Nevus. Right lower back. Moderate. 08/2019. - No evidence of recurrence today - Recommend regular full body skin exams - Recommend daily broad spectrum sunscreen SPF 30+ to sun-exposed areas, reapply every 2 hours as needed.  - Call if any new or changing lesions are noted between office visits   Lentigines - Scattered tan macules - Due to sun exposure - Benign-appearing, observe - Recommend daily broad spectrum sunscreen SPF 30+ to sun-exposed areas, reapply every 2 hours as needed. - Call for any changes  Seborrheic Keratoses - Stuck-on, waxy, tan-brown papules and/or plaques  - Benign-appearing -  Discussed benign etiology and prognosis. - Observe - Call for any changes  Melanocytic Nevi - Tan-brown and/or pink-flesh-colored symmetric macules and papules - Benign appearing on exam today - Observation - Call clinic for new or changing moles - Recommend daily use of broad spectrum spf 30+ sunscreen to sun-exposed areas.  - Check toenails when remove polish.   Hemangiomas - Red papules - Discussed benign nature - Observe - Call for any changes  Actinic Damage - Chronic condition, secondary to cumulative UV/sun exposure - diffuse scaly erythematous macules with underlying dyspigmentation - Recommend daily broad spectrum sunscreen SPF 30+ to sun-exposed areas, reapply every 2 hours as needed.  - Staying in the shade or wearing long sleeves, sun glasses (UVA+UVB protection) and wide brim hats (4-inch brim around the entire circumference of the hat) are also recommended for sun protection.  - Call for new or changing lesions.  Skin cancer screening performed today.  AK (actinic keratosis) (5) Right Upper Arm x3, left lateral thigh x2  Actinic keratoses are precancerous spots that appear secondary to cumulative UV radiation exposure/sun exposure over time. They are chronic with expected duration over 1 year. A portion of actinic keratoses will progress to squamous cell carcinoma of the skin. It is not possible to reliably predict which spots will progress to skin cancer and so treatment is recommended to prevent development of skin cancer.  Recommend daily broad spectrum sunscreen SPF 30+ to sun-exposed areas, reapply every 2 hours as needed.  Recommend staying in the shade or wearing long sleeves, sun glasses (UVA+UVB  protection) and wide brim hats (4-inch brim around the entire circumference of the hat). Call for new or changing lesions.  Destruction of lesion - Right Upper Arm x3, left lateral thigh x2  Destruction method: cryotherapy   Informed consent: discussed and consent  obtained   Lesion destroyed using liquid nitrogen: Yes   Region frozen until ice ball extended beyond lesion: Yes   Outcome: patient tolerated procedure well with no complications   Post-procedure details: wound care instructions given   Additional details:  Prior to procedure, discussed risks of blister formation, small wound, skin dyspigmentation, or rare scar following cryotherapy. Recommend Vaseline ointment to treated areas while healing.   Neoplasm of skin Right Nasal Sidewall  Skin / nail biopsy Type of biopsy: tangential   Informed consent: discussed and consent obtained   Anesthesia: the lesion was anesthetized in a standard fashion   Anesthesia comment:  Area prepped with alcohol Anesthetic:  1% lidocaine w/ epinephrine 1-100,000 buffered w/ 8.4% NaHCO3 Instrument used: flexible razor blade   Hemostasis achieved with: pressure, aluminum chloride and electrodesiccation   Outcome: patient tolerated procedure well   Post-procedure details: wound care instructions given   Post-procedure details comment:  Ointment and small bandage applied  Specimen 1 - Surgical pathology Differential Diagnosis: R/O BCC vs Nevus  Check Margins: No   Return in about 1 year (around 01/16/2024) for TBSE.  I, Emelia Salisbury, CMA, am acting as scribe for Forest Gleason, MD.  Documentation: I have reviewed the above documentation for accuracy and completeness, and I agree with the above.  Forest Gleason, MD

## 2023-01-16 ENCOUNTER — Encounter: Payer: Self-pay | Admitting: Dermatology

## 2023-01-20 ENCOUNTER — Telehealth: Payer: Self-pay

## 2023-01-20 NOTE — Telephone Encounter (Signed)
-----  Message from Alfonso Patten, MD sent at 01/20/2023  4:49 PM EST ----- Skin , right nasal sidewall BASAL CELL CARCINOMA, NODULAR PATTERN --> Mohs surgery with Dr. Manley Mason at [facility] or Dr. Lacinda Axon at [facility]. FBSE in 6 months.  MAs please call with results and refer. Please let me know if they have any questions. Thank you!

## 2023-01-20 NOTE — Telephone Encounter (Signed)
Left pt msg to call for bx results/sh 

## 2023-01-21 ENCOUNTER — Telehealth: Payer: Self-pay

## 2023-01-21 NOTE — Telephone Encounter (Signed)
-----  Message from Alfonso Patten, MD sent at 01/20/2023  4:49 PM EST ----- Skin , right nasal sidewall BASAL CELL CARCINOMA, NODULAR PATTERN --> Mohs surgery with Dr. Manley Mason at [facility] or Dr. Lacinda Axon at [facility]. FBSE in 6 months.  MAs please call with results and refer. Please let me know if they have any questions. Thank you!

## 2023-01-21 NOTE — Telephone Encounter (Signed)
Patient advised of BX results but would like to research [facility] surgeons before deciding. She will call tomorrow with preference. aw

## 2023-02-11 ENCOUNTER — Telehealth: Payer: Self-pay

## 2023-02-11 DIAGNOSIS — C44311 Basal cell carcinoma of skin of nose: Secondary | ICD-10-CM

## 2023-02-11 NOTE — Telephone Encounter (Signed)
-----   Message from Alfonso Patten, MD sent at 01/20/2023  4:49 PM EST ----- Skin , right nasal sidewall BASAL CELL CARCINOMA, NODULAR PATTERN --> Mohs surgery with Dr. Manley Mason at [facility] or Dr. Lacinda Axon at [facility]. FBSE in 6 months.  MAs please call with results and refer. Please let me know if they have any questions. Thank you!

## 2023-02-11 NOTE — Telephone Encounter (Signed)
LVM for patient to return call to office and let us know where she would like referral for Mohs sent to. Lurlean Horns., RMA

## 2023-02-12 NOTE — Telephone Encounter (Signed)
Referral sent to [facility]. Patient scheduled for 02/25/23 with Dr. Harl Bowie. aw

## 2023-02-12 NOTE — Addendum Note (Signed)
Addended by: Johnsie Kindred R on: 02/12/2023 04:50 PM   Modules accepted: Orders

## 2023-03-18 ENCOUNTER — Telehealth: Payer: Self-pay

## 2023-03-18 NOTE — Telephone Encounter (Signed)
Specimen tracking and history updated from MOHs progress notes. aw 

## 2023-05-14 ENCOUNTER — Other Ambulatory Visit: Payer: Self-pay | Admitting: Nurse Practitioner

## 2023-05-14 DIAGNOSIS — R748 Abnormal levels of other serum enzymes: Secondary | ICD-10-CM

## 2023-05-15 ENCOUNTER — Ambulatory Visit
Admission: RE | Admit: 2023-05-15 | Discharge: 2023-05-15 | Disposition: A | Discharge status: Home / Self Care | Payer: BC Managed Care – PPO | Source: Ambulatory Visit | Attending: Nurse Practitioner | Admitting: Nurse Practitioner

## 2023-05-15 ENCOUNTER — Other Ambulatory Visit: Payer: Self-pay | Admitting: Nurse Practitioner

## 2023-05-15 DIAGNOSIS — R748 Abnormal levels of other serum enzymes: Secondary | ICD-10-CM | POA: Diagnosis not present

## 2023-05-15 DIAGNOSIS — K219 Gastro-esophageal reflux disease without esophagitis: Secondary | ICD-10-CM

## 2023-05-23 ENCOUNTER — Ambulatory Visit
Admission: RE | Admit: 2023-05-23 | Discharge: 2023-05-23 | Disposition: A | Discharge status: Home / Self Care | Payer: BC Managed Care – PPO | Source: Ambulatory Visit | Attending: Nurse Practitioner | Admitting: Nurse Practitioner

## 2023-05-23 DIAGNOSIS — K219 Gastro-esophageal reflux disease without esophagitis: Secondary | ICD-10-CM | POA: Diagnosis present

## 2023-07-23 ENCOUNTER — Ambulatory Visit: Payer: BC Managed Care – PPO | Admitting: Dermatology

## 2023-08-26 ENCOUNTER — Encounter: Payer: Self-pay | Admitting: Dermatology

## 2023-08-26 ENCOUNTER — Ambulatory Visit: Payer: BC Managed Care – PPO | Admitting: Dermatology

## 2023-08-26 VITALS — BP 147/92 | HR 70

## 2023-08-26 DIAGNOSIS — L821 Other seborrheic keratosis: Secondary | ICD-10-CM

## 2023-08-26 DIAGNOSIS — L578 Other skin changes due to chronic exposure to nonionizing radiation: Secondary | ICD-10-CM | POA: Diagnosis not present

## 2023-08-26 DIAGNOSIS — L814 Other melanin hyperpigmentation: Secondary | ICD-10-CM

## 2023-08-26 DIAGNOSIS — W908XXA Exposure to other nonionizing radiation, initial encounter: Secondary | ICD-10-CM | POA: Diagnosis not present

## 2023-08-26 DIAGNOSIS — Z86018 Personal history of other benign neoplasm: Secondary | ICD-10-CM

## 2023-08-26 DIAGNOSIS — Z85828 Personal history of other malignant neoplasm of skin: Secondary | ICD-10-CM

## 2023-08-26 DIAGNOSIS — D229 Melanocytic nevi, unspecified: Secondary | ICD-10-CM

## 2023-08-26 DIAGNOSIS — Z1283 Encounter for screening for malignant neoplasm of skin: Secondary | ICD-10-CM

## 2023-08-26 DIAGNOSIS — Z872 Personal history of diseases of the skin and subcutaneous tissue: Secondary | ICD-10-CM

## 2023-08-26 DIAGNOSIS — D1801 Hemangioma of skin and subcutaneous tissue: Secondary | ICD-10-CM

## 2023-08-26 NOTE — Progress Notes (Signed)
   Follow-Up Visit   Subjective  Kristy Greene is a 62 y.o. female who presents for the following: Skin Cancer Screening and Full Body Skin Exam  The patient presents for Total-Body Skin Exam (TBSE) for skin cancer screening and mole check. The patient has spots, moles and lesions to be evaluated, some may be new or changing and the patient may have concern these could be cancer.  Patient with hx of BCC, AK's, dysplastic nevus.  The following portions of the chart were reviewed this encounter and updated as appropriate: medications, allergies, medical history  Review of Systems:  No other skin or systemic complaints except as noted in HPI or Assessment and Plan.  Objective  Well appearing patient in no apparent distress; mood and affect are within normal limits.  A full examination was performed including scalp, head, eyes, ears, nose, lips, neck, chest, axillae, abdomen, back, buttocks, bilateral upper extremities, bilateral lower extremities, hands, feet, fingers, toes, fingernails, and toenails. All findings within normal limits unless otherwise noted below.   Exam of face limited by presence of make up. Exam of nails limited by presence of nail polish. Patient advises she just had toenails painted and there are no dark lines.   Relevant physical exam findings are noted in the Assessment and Plan.    Assessment & Plan   SKIN CANCER SCREENING PERFORMED TODAY.  ACTINIC DAMAGE - Chronic condition, secondary to cumulative UV/sun exposure - diffuse scaly erythematous macules with underlying dyspigmentation - Recommend daily broad spectrum sunscreen SPF 30+ to sun-exposed areas, reapply every 2 hours as needed.  - Staying in the shade or wearing long sleeves, sun glasses (UVA+UVB protection) and wide brim hats (4-inch brim around the entire circumference of the hat) are also recommended for sun protection.  - Call for new or changing lesions.  LENTIGINES, SEBORRHEIC KERATOSES,  HEMANGIOMAS - Benign normal skin lesions - Benign-appearing - Call for any changes  MELANOCYTIC NEVI - Tan-brown and/or pink-flesh-colored symmetric macules and papules - Benign appearing on exam today - Observation - Call clinic for new or changing moles - Recommend daily use of broad spectrum spf 30+ sunscreen to sun-exposed areas.   HISTORY OF BASAL CELL CARCINOMA OF THE SKIN - Right Nasal Sidewall, MOHs 02/25/23  - No evidence of recurrence today - Recommend regular full body skin exams - Recommend daily broad spectrum sunscreen SPF 30+ to sun-exposed areas, reapply every 2 hours as needed.  - Call if any new or changing lesions are noted between office visits  History of Dysplastic Nevi - right lower back/moderate 08/2019 - No evidence of recurrence today - Recommend regular full body skin exams - Recommend daily broad spectrum sunscreen SPF 30+ to sun-exposed areas, reapply every 2 hours as needed.  - Call if any new or changing lesions are noted between office visits      Return in about 1 year (around 08/25/2024) for TBSE, Hx BCC, Hx AK, Hx Dysplastic Nevi.  Kristy Greene, RMA, am acting as scribe for Elie Goody, MD .   Documentation: I have reviewed the above documentation for accuracy and completeness, and I agree with the above.  Elie Goody, MD

## 2023-08-26 NOTE — Patient Instructions (Signed)

## 2024-01-15 ENCOUNTER — Other Ambulatory Visit: Payer: Self-pay | Admitting: Internal Medicine

## 2024-01-15 DIAGNOSIS — Z1231 Encounter for screening mammogram for malignant neoplasm of breast: Secondary | ICD-10-CM

## 2024-01-21 ENCOUNTER — Encounter: Payer: BC Managed Care – PPO | Admitting: Dermatology

## 2024-01-27 ENCOUNTER — Ambulatory Visit
Admission: RE | Admit: 2024-01-27 | Discharge: 2024-01-27 | Disposition: A | Discharge status: Home / Self Care | Payer: 59 | Source: Ambulatory Visit | Attending: Internal Medicine | Admitting: Internal Medicine

## 2024-01-27 DIAGNOSIS — Z1231 Encounter for screening mammogram for malignant neoplasm of breast: Secondary | ICD-10-CM | POA: Diagnosis present

## 2024-02-10 ENCOUNTER — Encounter: Payer: BC Managed Care – PPO | Admitting: Dermatology

## 2024-02-25 ENCOUNTER — Ambulatory Visit: Payer: 59 | Admitting: Dermatology

## 2024-02-25 ENCOUNTER — Encounter: Payer: Self-pay | Admitting: Dermatology

## 2024-02-25 DIAGNOSIS — L578 Other skin changes due to chronic exposure to nonionizing radiation: Secondary | ICD-10-CM

## 2024-02-25 DIAGNOSIS — Z86018 Personal history of other benign neoplasm: Secondary | ICD-10-CM

## 2024-02-25 DIAGNOSIS — Z85828 Personal history of other malignant neoplasm of skin: Secondary | ICD-10-CM

## 2024-02-25 DIAGNOSIS — L821 Other seborrheic keratosis: Secondary | ICD-10-CM

## 2024-02-25 DIAGNOSIS — D229 Melanocytic nevi, unspecified: Secondary | ICD-10-CM

## 2024-02-25 DIAGNOSIS — W908XXA Exposure to other nonionizing radiation, initial encounter: Secondary | ICD-10-CM

## 2024-02-25 DIAGNOSIS — L814 Other melanin hyperpigmentation: Secondary | ICD-10-CM | POA: Diagnosis not present

## 2024-02-25 DIAGNOSIS — D1801 Hemangioma of skin and subcutaneous tissue: Secondary | ICD-10-CM

## 2024-02-25 DIAGNOSIS — L570 Actinic keratosis: Secondary | ICD-10-CM

## 2024-02-25 DIAGNOSIS — Z1283 Encounter for screening for malignant neoplasm of skin: Secondary | ICD-10-CM

## 2024-02-25 NOTE — Progress Notes (Signed)
 Follow-Up Visit   Subjective  Kristy Greene is a 63 y.o. female who presents for the following: Skin Cancer Screening and Full Body Skin Exam. Hx of BCC, dysplastic nevus.   The patient presents for Total-Body Skin Exam (TBSE) for skin cancer screening and mole check. The patient has spots, moles and lesions to be evaluated, some may be new or changing and the patient may have concern these could be cancer.    The following portions of the chart were reviewed this encounter and updated as appropriate: medications, allergies, medical history  Review of Systems:  No other skin or systemic complaints except as noted in HPI or Assessment and Plan.  Objective  Well appearing patient in no apparent distress; mood and affect are within normal limits.  A full examination was performed including scalp, head, eyes, ears, nose, lips, neck, chest, axillae, abdomen, back, buttocks, bilateral upper extremities, bilateral lower extremities, hands, feet, fingers, toes, fingernails, and toenails. All findings within normal limits unless otherwise noted below.   Relevant physical exam findings are noted in the Assessment and Plan.  R helix x1, L cheek x1, L helix x1 (3) Pink scaly macules  Assessment & Plan   SKIN CANCER SCREENING PERFORMED TODAY.  ACTINIC DAMAGE - Chronic condition, secondary to cumulative UV/sun exposure - diffuse scaly erythematous macules with underlying dyspigmentation - Recommend daily broad spectrum sunscreen SPF 30+ to sun-exposed areas, reapply every 2 hours as needed.  - Staying in the shade or wearing long sleeves, sun glasses (UVA+UVB protection) and wide brim hats (4-inch brim around the entire circumference of the hat) are also recommended for sun protection.  - Call for new or changing lesions.  LENTIGINES, SEBORRHEIC KERATOSES, HEMANGIOMAS - Benign normal skin lesions - Benign-appearing - Call for any changes  MELANOCYTIC NEVI - Tan-brown and/or  pink-flesh-colored symmetric macules and papules - Benign appearing on exam today - Observation - Call clinic for new or changing moles - Recommend daily use of broad spectrum spf 30+ sunscreen to sun-exposed areas.   HISTORY OF BASAL CELL CARCINOMA OF THE SKIN - Right Nasal Sidewall, MOHs 02/25/23  - No evidence of recurrence today - Recommend regular full body skin exams - Recommend daily broad spectrum sunscreen SPF 30+ to sun-exposed areas, reapply every 2 hours as needed.  - Call if any new or changing lesions are noted between office visits   History of Dysplastic Nevi - right lower back/moderate 08/2019 - No evidence of recurrence today - Recommend regular full body skin exams - Recommend daily broad spectrum sunscreen SPF 30+ to sun-exposed areas, reapply every 2 hours as needed.  - Call if any new or changing lesions are noted between office visits  AK (ACTINIC KERATOSIS) (3) R helix x1, L cheek x1, L helix x1 (3) Actinic keratoses are precancerous spots that appear secondary to cumulative UV radiation exposure/sun exposure over time. They are chronic with expected duration over 1 year. A portion of actinic keratoses will progress to squamous cell carcinoma of the skin. It is not possible to reliably predict which spots will progress to skin cancer and so treatment is recommended to prevent development of skin cancer.  Recommend daily broad spectrum sunscreen SPF 30+ to sun-exposed areas, reapply every 2 hours as needed.  Recommend staying in the shade or wearing long sleeves, sun glasses (UVA+UVB protection) and wide brim hats (4-inch brim around the entire circumference of the hat). Call for new or changing lesions. Destruction of lesion - R helix x1,  L cheek x1, L helix x1 (3) Complexity: simple   Destruction method: cryotherapy   Informed consent: discussed and consent obtained   Timeout:  patient name, date of birth, surgical site, and procedure verified Lesion destroyed  using liquid nitrogen: Yes   Region frozen until ice ball extended beyond lesion: Yes   Cryo cycles: 1 or 2. Outcome: patient tolerated procedure well with no complications   Post-procedure details: wound care instructions given   MULTIPLE BENIGN NEVI   LENTIGINES   ACTINIC ELASTOSIS   SEBORRHEIC KERATOSES   CHERRY ANGIOMA      Return in about 6 months (around 08/24/2024) for Hx BCC, Hx Dysplastic Nevi, TBSE, w/ Dr. Katrinka Blazing.  Wynonia Lawman, CMA, am acting as scribe for Elie Goody, MD .   Documentation: I have reviewed the above documentation for accuracy and completeness, and I agree with the above.  Elie Goody, MD

## 2024-02-25 NOTE — Patient Instructions (Addendum)

## 2024-04-01 ENCOUNTER — Encounter: Payer: Self-pay | Admitting: Dermatology

## 2024-04-01 ENCOUNTER — Ambulatory Visit: Payer: 59 | Admitting: Dermatology

## 2024-04-01 DIAGNOSIS — D219 Benign neoplasm of connective and other soft tissue, unspecified: Secondary | ICD-10-CM | POA: Insufficient documentation

## 2024-04-01 DIAGNOSIS — L821 Other seborrheic keratosis: Secondary | ICD-10-CM

## 2024-04-01 NOTE — Patient Instructions (Addendum)

## 2024-04-01 NOTE — Progress Notes (Signed)
   Follow-Up Visit   Subjective  Kristy Greene is a 63 y.o. female who presents for the following: dark spots face, no symptoms The patient has spots, moles and lesions to be evaluated, some may be new or changing and the patient may have concern these could be cancer.   The following portions of the chart were reviewed this encounter and updated as appropriate: medications, allergies, medical history  Review of Systems:  No other skin or systemic complaints except as noted in HPI or Assessment and Plan.  Objective  Well appearing patient in no apparent distress; mood and affect are within normal limits.   A focused examination was performed of the following areas: face  Relevant exam findings are noted in the Assessment and Plan.  L cheek on anterior jaw line x 1 Stuck on waxy paps face    Assessment & Plan     SEBORRHEIC KERATOSIS L cheek on anterior jaw line x 1 Discussed cosmetic procedure SK, noncovered.  $60 for 1st lesion and $15 for each additional lesion if done on the same day.  Maximum charge $350.  One touch-up treatment included no charge. Discussed risks of treatment including dyspigmentation, small scar, and/or recurrence. Recommend daily broad spectrum sunscreen SPF 30+/photoprotection to treated areas once healed.  Discussed starting with treating one today at no charge to see how it heals, if heals well will plan to treat other cosmetic Sks  TREATED ONE LESION AS TEST. DO NOT BILL  Destruction of lesion - L cheek on anterior jaw line x 1 Complexity: simple   Destruction method: cryotherapy   Informed consent: discussed and consent obtained   Timeout:  patient name, date of birth, surgical site, and procedure verified Lesion destroyed using liquid nitrogen: Yes   Region frozen until ice ball extended beyond lesion: Yes   Cryo cycles: 1 or 2. Outcome: patient tolerated procedure well with no complications   Post-procedure details: wound care  instructions given    Return in about 1 month (around 05/01/2024) for cosmetic SK.  I, Ardis Rowan, RMA, am acting as scribe for Elie Goody, MD .   Documentation: I have reviewed the above documentation for accuracy and completeness, and I agree with the above.  Elie Goody, MD

## 2024-05-13 ENCOUNTER — Ambulatory Visit: Admitting: Dermatology

## 2024-05-20 ENCOUNTER — Ambulatory Visit: Admitting: Dermatology

## 2024-06-15 ENCOUNTER — Other Ambulatory Visit (HOSPITAL_COMMUNITY)
Admission: RE | Admit: 2024-06-15 | Discharge: 2024-06-15 | Disposition: A | Discharge status: Home / Self Care | Source: Ambulatory Visit | Attending: Obstetrics and Gynecology | Admitting: Obstetrics and Gynecology

## 2024-06-15 ENCOUNTER — Encounter: Payer: Self-pay | Admitting: Obstetrics and Gynecology

## 2024-06-15 ENCOUNTER — Ambulatory Visit (INDEPENDENT_AMBULATORY_CARE_PROVIDER_SITE_OTHER): Admitting: Obstetrics and Gynecology

## 2024-06-15 VITALS — BP 106/72 | HR 81 | Ht 66.0 in | Wt 210.3 lb

## 2024-06-15 DIAGNOSIS — Z01419 Encounter for gynecological examination (general) (routine) without abnormal findings: Secondary | ICD-10-CM | POA: Insufficient documentation

## 2024-06-15 DIAGNOSIS — Z124 Encounter for screening for malignant neoplasm of cervix: Secondary | ICD-10-CM | POA: Diagnosis present

## 2024-06-15 DIAGNOSIS — N8189 Other female genital prolapse: Secondary | ICD-10-CM

## 2024-06-15 NOTE — Progress Notes (Signed)
 HPI:      [name] is a 63 y.o. [phone] who LMP was No LMP recorded. Patient is postmenopausal.  Subjective:   She presents today for her annual examination.  She reports that she is doing fine.  She is planning her retirement for the past year and is very excited about being retired. She has a remote history of positive HPV and is due for Pap smear this year.    Hx: The following portions of the patient's history were reviewed and updated as appropriate:             She  has a past medical history of Acid reflux, Actinic keratosis, AR (allergic rhinitis), Basal cell carcinoma (01/15/2023), Cystocele, Dysplastic nevus (09/23/2019), HPV test positive, Hypercholesteremia, Increased BMI, Menopausal state, Rectocele, SUI (stress urinary incontinence, female), Uterine fibroid, Uterine prolapse, and Vaginal dryness. She does not have any pertinent problems on file. She  has a past surgical history that includes Cesarean section and Breast cyst aspiration (Left, 2005). Her family history includes Colon cancer in her maternal grandfather; Diabetes in her mother and paternal grandmother; Heart disease in her mother; Uterine cancer in her sister. She  reports that she has never smoked. She has never used smokeless tobacco. She reports current alcohol use. She reports that she does not use drugs. She has a current medication list which includes the following prescription(s): amlodipine, calcium-vitamin d, famotidine, latanoprost, multiple vitamin, and pantoprazole. She has no known allergies.       Review of Systems:  Review of Systems  Constitutional: Denied constitutional symptoms, night sweats, recent illness, fatigue, fever, insomnia and weight loss.  Eyes: Denied eye symptoms, eye pain, photophobia, vision change and visual disturbance.  Ears/Nose/Throat/Neck: Denied ear, nose, throat or neck symptoms, hearing loss, nasal discharge, sinus congestion and sore throat.  Cardiovascular:  Denied cardiovascular symptoms, arrhythmia, chest pain/pressure, edema, exercise intolerance, orthopnea and palpitations.  Respiratory: Denied pulmonary symptoms, asthma, pleuritic pain, productive sputum, cough, dyspnea and wheezing.  Gastrointestinal: Denied, gastro-esophageal reflux, melena, nausea and vomiting.  Genitourinary: Denied genitourinary symptoms including symptomatic vaginal discharge, pelvic relaxation issues, and urinary complaints.  Musculoskeletal: Denied musculoskeletal symptoms, stiffness, swelling, muscle weakness and myalgia.  Dermatologic: Denied dermatology symptoms, rash and scar.  Neurologic: Denied neurology symptoms, dizziness, headache, neck pain and syncope.  Psychiatric: Denied psychiatric symptoms, anxiety and depression.  Endocrine: Denied endocrine symptoms including hot flashes and night sweats.   Meds:   Current Outpatient Medications on File Prior to Visit  Medication Sig Dispense Refill   amLODipine (NORVASC) 5 MG tablet Take by mouth.     calcium-vitamin D 250-100 MG-UNIT tablet Take 1 tablet by mouth 2 (two) times daily.     famotidine (PEPCID) 40 MG tablet      latanoprost (XALATAN) 0.005 % ophthalmic solution Apply to eye.     Multiple Vitamin (MULTI VITAMIN DAILY PO) Take by mouth.     pantoprazole (PROTONIX) 40 MG tablet      No current facility-administered medications on file prior to visit.     Objective:     Vitals:   06/15/24 1316  BP: 106/72  Pulse: 81    Filed Weights   06/15/24 1316  Weight: 210 lb 4.8 oz (95.4 kg)              Physical examination    Pelvic:   Vulva: Normal appearance.  No lesions.  Vagina: No lesions or abnormalities noted.  Support: Second-degree cystocele and rectocele  Urethra No  masses tenderness or scarring.  Meatus Normal size without lesions or prolapse.  Cervix: Normal appearance.  No lesions.  Anus: Normal exam.  No lesions.  Perineum: Normal exam.  No lesions.        Bimanual   Uterus:  Normal size.  Non-tender.  Mobile.  AV.  Adnexae: No masses.  Non-tender to palpation.  Cul-de-sac: Negative for abnormality.     Assessment:    G2P2002 Patient Active Problem List   Diagnosis Date Noted   Fibroids 04/01/2024   Gastroesophageal reflux disease without esophagitis 09/27/2019   Obesity (BMI 30.0-34.9) 04/29/2017   Constipation 04/29/2017   Chronic fatigue 08/27/2014   HTN (hypertension), benign 08/27/2014     1. Well woman exam with routine gynecological exam   2. Cervical cancer screening   3. Pelvic relaxation     Normal exam  Relatively unchanged pelvic relaxation patient asymptomatic   Plan:            1.  Basic Screening Recommendations The basic screening recommendations for asymptomatic women were discussed with the patient during her visit.  The age-appropriate recommendations were discussed with her and the rational for the tests reviewed.  When I am informed by the patient that another primary care physician has previously obtained the age-appropriate tests and they are up-to-date, only outstanding tests are ordered and referrals given as necessary.  Abnormal results of tests will be discussed with her when all of her results are completed.  Routine preventative health maintenance measures emphasized: Exercise/Diet/Weight control, Tobacco Warnings, Alcohol/Substance use risks and Stress Management  Pap performed -up-to-date on mammography  Orders No orders of the defined types were placed in this encounter.   No orders of the defined types were placed in this encounter.        F/U  Return in about 1 year (around 06/15/2025) for Annual Physical.  Delice Felt, M.D. 06/15/2024 1:43 PM

## 2024-06-15 NOTE — Progress Notes (Signed)
 Patients presents for annual exam today. She is postmenopausal and states she is doing well, getting ready to retire. Due for pap smear, ordered. Up to date with mammogram. Annual labs are completed with PCP. She states no other questions or concerns at this time.

## 2024-06-18 LAB — CYTOLOGY - PAP
Comment: NEGATIVE
Diagnosis: NEGATIVE
Diagnosis: REACTIVE
High risk HPV: NEGATIVE

## 2024-08-17 ENCOUNTER — Other Ambulatory Visit: Payer: Self-pay | Admitting: Gastroenterology

## 2024-08-17 DIAGNOSIS — K760 Fatty (change of) liver, not elsewhere classified: Secondary | ICD-10-CM

## 2024-08-24 ENCOUNTER — Ambulatory Visit: Payer: 59 | Admitting: Dermatology

## 2024-08-26 ENCOUNTER — Ambulatory Visit: Payer: BC Managed Care – PPO | Admitting: Dermatology

## 2024-08-26 ENCOUNTER — Encounter: Payer: Self-pay | Admitting: Dermatology

## 2024-08-26 DIAGNOSIS — L578 Other skin changes due to chronic exposure to nonionizing radiation: Secondary | ICD-10-CM

## 2024-08-26 DIAGNOSIS — L821 Other seborrheic keratosis: Secondary | ICD-10-CM

## 2024-08-26 DIAGNOSIS — D1801 Hemangioma of skin and subcutaneous tissue: Secondary | ICD-10-CM

## 2024-08-26 DIAGNOSIS — Z85828 Personal history of other malignant neoplasm of skin: Secondary | ICD-10-CM

## 2024-08-26 DIAGNOSIS — Z1283 Encounter for screening for malignant neoplasm of skin: Secondary | ICD-10-CM | POA: Diagnosis not present

## 2024-08-26 DIAGNOSIS — Z86018 Personal history of other benign neoplasm: Secondary | ICD-10-CM

## 2024-08-26 DIAGNOSIS — W908XXA Exposure to other nonionizing radiation, initial encounter: Secondary | ICD-10-CM | POA: Diagnosis not present

## 2024-08-26 DIAGNOSIS — L814 Other melanin hyperpigmentation: Secondary | ICD-10-CM

## 2024-08-26 DIAGNOSIS — D229 Melanocytic nevi, unspecified: Secondary | ICD-10-CM

## 2024-08-26 NOTE — Patient Instructions (Signed)

## 2024-08-26 NOTE — Progress Notes (Signed)
   Follow-Up Visit   Subjective  Kristy Greene is a 64 y.o. female who presents for the following: Skin Cancer Screening and Full Body Skin Exam. Hx of BCC. Hx of DN. New spot at left medial breast.   The patient presents for Total-Body Skin Exam (TBSE) for skin cancer screening and mole check. The patient has spots, moles and lesions to be evaluated, some may be new or changing and the patient may have concern these could be cancer.    The following portions of the chart were reviewed this encounter and updated as appropriate: medications, allergies, medical history  Review of Systems:  No other skin or systemic complaints except as noted in HPI or Assessment and Plan.  Objective  Well appearing patient in no apparent distress; mood and affect are within normal limits.  A full examination was performed including scalp, head, eyes, ears, nose, lips, neck, chest, axillae, abdomen, back, buttocks, bilateral upper extremities, bilateral lower extremities, hands, feet, fingers, toes, fingernails, and toenails. All findings within normal limits unless otherwise noted below.   Exam of nails limited by presence of nail polish.  Relevant physical exam findings are noted in the Assessment and Plan.      Assessment & Plan   SKIN CANCER SCREENING PERFORMED TODAY.  HISTORY OF BASAL CELL CARCINOMA OF THE SKIN. Right nasal sidewall. Mohs 02/25/2023. - No evidence of recurrence today - Recommend regular full body skin exams - Recommend daily broad spectrum sunscreen SPF 30+ to sun-exposed areas, reapply every 2 hours as needed.  - Call if any new or changing lesions are noted between office visits  HISTORY OF DYSPLASTIC NEVUS. Right lower back, moderate. 09/23/2019. No evidence of recurrence today Recommend regular full body skin exams Recommend daily broad spectrum sunscreen SPF 30+ to sun-exposed areas, reapply every 2 hours as needed.  Call if any new or changing lesions are noted  between office visits   ACTINIC DAMAGE - Chronic condition, secondary to cumulative UV/sun exposure - diffuse scaly erythematous macules with underlying dyspigmentation - Recommend daily broad spectrum sunscreen SPF 30+ to sun-exposed areas, reapply every 2 hours as needed.  - Staying in the shade or wearing long sleeves, sun glasses (UVA+UVB protection) and wide brim hats (4-inch brim around the entire circumference of the hat) are also recommended for sun protection.  - Call for new or changing lesions.  LENTIGINES, SEBORRHEIC KERATOSES, HEMANGIOMAS - Benign normal skin lesions - Benign-appearing - Call for any changes  MELANOCYTIC NEVI - Tan-brown and/or pink-flesh-colored symmetric macules and papules - Benign appearing on exam today - Observation - Call clinic for new or changing moles - Recommend daily use of broad spectrum spf 30+ sunscreen to sun-exposed areas.  - Check nails when remove polish.   SEBORRHEIC KERATOSIS - Stuck-on, waxy, tan-brown papule at left medial breast. - Benign-appearing - Discussed benign etiology and prognosis. - Observe - Call for any changes     MULTIPLE BENIGN NEVI   LENTIGINES   ACTINIC ELASTOSIS   SEBORRHEIC KERATOSES   CHERRY ANGIOMA    Return in about 1 year (around 08/26/2025) for TBSE, HxDN, HxBCC.  I, Jill Parcell, CMA, am acting as scribe for Boneta Sharps, MD.   Documentation: I have reviewed the above documentation for accuracy and completeness, and I agree with the above.  Boneta Sharps, MD

## 2024-09-01 ENCOUNTER — Ambulatory Visit
Admission: RE | Admit: 2024-09-01 | Discharge: 2024-09-01 | Disposition: A | Discharge status: Home / Self Care | Source: Ambulatory Visit | Attending: Gastroenterology | Admitting: Gastroenterology

## 2024-09-01 DIAGNOSIS — K760 Fatty (change of) liver, not elsewhere classified: Secondary | ICD-10-CM | POA: Diagnosis present

## 2024-09-14 ENCOUNTER — Encounter: Payer: Self-pay | Admitting: *Deleted

## 2024-10-04 ENCOUNTER — Ambulatory Visit: Admission: RE | Admit: 2024-10-04

## 2024-10-04 HISTORY — DX: Leiomyoma of uterus, unspecified: D25.9

## 2024-10-04 HISTORY — DX: Constipation, unspecified: K59.00

## 2024-10-04 HISTORY — DX: Essential (primary) hypertension: I10

## 2024-10-04 HISTORY — DX: Chronic fatigue, unspecified: R53.82

## 2024-10-04 SURGERY — EGD (ESOPHAGOGASTRODUODENOSCOPY)
Anesthesia: General

## 2024-11-04 ENCOUNTER — Other Ambulatory Visit: Payer: Self-pay | Admitting: Internal Medicine

## 2024-11-04 DIAGNOSIS — Z1231 Encounter for screening mammogram for malignant neoplasm of breast: Secondary | ICD-10-CM

## 2025-02-08 ENCOUNTER — Encounter

## 2025-08-30 ENCOUNTER — Ambulatory Visit: Admitting: Dermatology
